# Patient Record
Sex: Female | Born: 1975 | Race: Black or African American | Hispanic: No | Smoking: Current every day smoker
Health system: Southern US, Community
[De-identification: ages and names within clinical notes are randomized; demographics above are authoritative.]

## PROBLEM LIST (undated history)

## (undated) DIAGNOSIS — J45909 Unspecified asthma, uncomplicated: Secondary | ICD-10-CM

## (undated) DIAGNOSIS — F419 Anxiety disorder, unspecified: Secondary | ICD-10-CM

## (undated) DIAGNOSIS — D649 Anemia, unspecified: Secondary | ICD-10-CM

## (undated) HISTORY — PX: TUBAL LIGATION: SHX77

---

## 2007-09-04 ENCOUNTER — Emergency Department: Payer: Self-pay | Admitting: Emergency Medicine

## 2015-09-21 ENCOUNTER — Emergency Department
Admission: EM | Admit: 2015-09-21 | Discharge: 2015-09-21 | Disposition: A | Discharge status: Home / Self Care | Payer: BLUE CROSS/BLUE SHIELD | Attending: Emergency Medicine | Admitting: Emergency Medicine

## 2015-09-21 ENCOUNTER — Encounter: Payer: Self-pay | Admitting: Emergency Medicine

## 2015-09-21 ENCOUNTER — Emergency Department: Payer: BLUE CROSS/BLUE SHIELD

## 2015-09-21 DIAGNOSIS — R0789 Other chest pain: Secondary | ICD-10-CM | POA: Diagnosis not present

## 2015-09-21 DIAGNOSIS — R002 Palpitations: Secondary | ICD-10-CM

## 2015-09-21 DIAGNOSIS — J45909 Unspecified asthma, uncomplicated: Secondary | ICD-10-CM | POA: Insufficient documentation

## 2015-09-21 DIAGNOSIS — F17210 Nicotine dependence, cigarettes, uncomplicated: Secondary | ICD-10-CM | POA: Diagnosis not present

## 2015-09-21 DIAGNOSIS — F419 Anxiety disorder, unspecified: Secondary | ICD-10-CM | POA: Diagnosis not present

## 2015-09-21 DIAGNOSIS — F17200 Nicotine dependence, unspecified, uncomplicated: Secondary | ICD-10-CM

## 2015-09-21 HISTORY — DX: Anxiety disorder, unspecified: F41.9

## 2015-09-21 HISTORY — DX: Anemia, unspecified: D64.9

## 2015-09-21 HISTORY — DX: Unspecified asthma, uncomplicated: J45.909

## 2015-09-21 LAB — BASIC METABOLIC PANEL
Anion gap: 8 (ref 5–15)
BUN: 8 mg/dL (ref 6–20)
CALCIUM: 9.1 mg/dL (ref 8.9–10.3)
CO2: 22 mmol/L (ref 22–32)
Chloride: 106 mmol/L (ref 101–111)
Creatinine, Ser: 0.67 mg/dL (ref 0.44–1.00)
GFR calc Af Amer: >60 mL/min (ref >60)
GFR calc non Af Amer: >60 mL/min (ref >60)
GLUCOSE: 99 mg/dL (ref 65–99)
POTASSIUM: 4 mmol/L (ref 3.5–5.1)
SODIUM: 136 mmol/L (ref 135–145)

## 2015-09-21 LAB — CBC
HCT: 39.7 % (ref 35.0–47.0)
HEMOGLOBIN: 13.8 g/dL (ref 12.0–16.0)
MCH: 29.4 pg (ref 26.0–34.0)
MCHC: 34.7 g/dL (ref 32.0–36.0)
MCV: 84.9 fL (ref 80.0–100.0)
Platelets: 335 10*3/uL (ref 150–440)
RBC: 4.68 MIL/uL (ref 3.80–5.20)
RDW: 14.6 % — ABNORMAL HIGH (ref 11.5–14.5)
WBC: 6.4 10*3/uL (ref 3.6–11.0)

## 2015-09-21 LAB — TSH: TSH: 0.4 u[IU]/mL (ref 0.350–4.500)

## 2015-09-21 LAB — TROPONIN I: Troponin I: <0.03 ng/mL (ref <0.03)

## 2015-09-21 NOTE — ED Provider Notes (Signed)
[facility] Emergency Department Provider Note  ____________________________________________  Time seen: Approximately 2:38 PM  I have reviewed the triage vital signs and the nursing notes.   HISTORY  Chief Complaint Palpitations    HPI Sheryl Bernard is a 40 y.o. female with a long history of anxiety currently off her medications, ongoing tobacco abuse, presenting with anxiety, palpitations, and chest tightness. The patient reports that her anxiety and panic attacks are previously well-controlled with paroxetine, but she has not been on those medications for several years. She generally treats her symptoms, which are described as shakiness, palpitations "like my heart is going to pound out of my chest" and occasional chest tightness with deep breaths. She recently restarted her health insurance, and was bringing her son to be evaluated in the emergency department when she began to have her typical symptoms so she checked in to be evaluated. She has no new symptoms today, nor are her symptoms different in severity or character. She denies any lightheadedness, fainting, leg swelling or calf pain. She does report a desire to initiate smoking cessation.   Past Medical History:  Diagnosis Date  . Anemia   . Anxiety   . Asthma     There are no active problems to display for this patient.   Past Surgical History:  Procedure Laterality Date  . TUBAL LIGATION        Allergies Bee venom; Tetracyclines & related; Ciprofloxacin; and Penicillins  No family history on file.  Social History Social History  Substance Use Topics  . Smoking status: Current Every Day Smoker    Packs/day: 1.00    Types: Cigarettes  . Smokeless tobacco: Never Used  . Alcohol use No    Review of Systems Constitutional: No fever/chills.No lightheadedness or syncope. Eyes: No visual changes. ENT: No sore throat. No congestion or rhinorrhea. Cardiovascular: Positive chest  tightness. Positive palpitations. Respiratory: Denies shortness of breath.  No cough. Gastrointestinal: No abdominal pain.  No nausea, no vomiting.  No diarrhea.  No constipation. Genitourinary: Negative for dysuria. Musculoskeletal: Negative for back pain. Skin: Negative for rash. Neurological: Negative for headaches. No focal numbness, tingling or weakness.  Psychiatric:Positive anxiety.  10-point ROS otherwise negative.  ____________________________________________   PHYSICAL EXAM:  VITAL SIGNS: ED Triage Vitals  Enc Vitals Group     BP 09/21/15 1133 128/70     Pulse Rate 09/21/15 1133 70     Resp 09/21/15 1133 18     Temp 09/21/15 1133 98.4 F (36.9 C)     Temp Source 09/21/15 1133 Oral     SpO2 09/21/15 1133 97 %     Weight 09/21/15 1134 188 lb (85.3 kg)     Height 09/21/15 1134 5\' 6"  (1.676 m)     Head Circumference --      Peak Flow --      Pain Score 09/21/15 1135 2     Pain Loc --      Pain Edu? --      Excl. in [place]? --     Constitutional: Alert and oriented. Anxious appearing but overall well appearing and in no acute distress. Answers questions appropriately. Eyes: Conjunctivae are normal.  EOMI. No scleral icterus. Head: Atraumatic. Nose: No congestion/rhinnorhea. Mouth/Throat: Mucous membranes are moist.  Neck: No stridor.  Supple.  No JVD. Cardiovascular: Normal rate, regular rhythm. No murmurs, rubs or gallops.  Respiratory: Normal respiratory effort.  No accessory muscle use or retractions. Lungs CTAB.  No wheezes, rales or ronchi. Gastrointestinal:  Soft, nontender and nondistended.  No guarding or rebound.  No peritoneal signs. Musculoskeletal: No LE edema. No ttp in the calves or palpable cords.  Negative Homan's sign. Neurologic:  A&Ox3.  Speech is clear.  Face and smile are symmetric.  EOMI.  Moves all extremities well. Skin:  Skin is warm, dry and intact. No rash noted. Psychiatric: Anxious mood without pressured speech. Speech and behavior are  normal.  Normal judgement.  ____________________________________________   LABS (all labs ordered are listed, but only abnormal results are displayed)  Labs Reviewed  CBC - Abnormal; Notable for the following:       Result Value   RDW 14.6 (*)    All other components within normal limits  BASIC METABOLIC PANEL  TROPONIN I  TSH   ____________________________________________  EKG  ED ECG REPORT I, Eula Listen, the attending physician, personally viewed and interpreted this ECG.   Date: 09/21/2015  EKG Time: 1457  Rate: 71  Rhythm: normal sinus rhythm  Axis: Normal  Intervals:none  ST&T Change: Nonspecific T-wave inversion in V1. No ST elevation. No evidence of Brugada, prolonged QTC, or hypertrophy.  ____________________________________________  RADIOLOGY  Dg Chest 2 View  Result Date: 09/21/2015 CLINICAL DATA:  Palpitations and shortness of breath. History of asthma. EXAM: CHEST  2 VIEW COMPARISON:  None. FINDINGS: Mild cardiac enlargement. No pleural effusion or edema. No airspace opacities. The visualized osseous structures are unremarkable. IMPRESSION: 1. No acute cardiopulmonary abnormalities. Electronically Signed   By: Kerby Moors M.D.   On: 09/21/2015 12:05    ____________________________________________   PROCEDURES  Procedure(s) performed: None  Procedures  Critical Care performed: No ____________________________________________   INITIAL IMPRESSION / ASSESSMENT AND PLAN / ED COURSE  Pertinent labs & imaging results that were available during my care of the patient were reviewed by me and considered in my medical decision making (see chart for details).  40 y.o. female with a history of anxiety currently off her medications presenting with typical anxiety symptoms. Overall, the patient does appear anxious but has no focal cardiopulmonary findings. Her EKG does not show any arrhythmia or ischemic changes. She has reassuring laboratory  studies with normal electrolytes, no evidence of anemia, and a normal troponin. Her TSH is pending and she will have her primary care physician follow this up. We had a long discussion about smoking cessation and she will follow up with her primary care physician for this is well. At this time, there is no evidence that the patient has any acute life-threatening condition causing her symptoms. I have given her the number for the [facility] clinic, so that she may reestablish a primary care physician for routine health maintenance, evaluation of her anxiety, follow-up for her TSH. Ouida Sills return precautions as well as follow-up instructions.  ____________________________________________  FINAL CLINICAL IMPRESSION(S) / ED DIAGNOSES  Final diagnoses:  Palpitations  Anxiety  Smoking addiction    Clinical Course      NEW MEDICATIONS STARTED DURING THIS VISIT:  New Prescriptions   No medications on file      Eula Listen, MD 09/21/15 1443

## 2015-09-21 NOTE — ED Notes (Signed)
Patient is anxious.  States has taken medications for anxiety in the past.

## 2015-09-21 NOTE — ED Triage Notes (Signed)
Patient states she is under a lot of stress and has been feeling shaky, palpitations, SOB since Tuesday.

## 2015-09-21 NOTE — Discharge Instructions (Signed)
Please establish a primary care physician at the [facility] clinic who will be able to address your anxiety, help you quit smoking, and for routine health maintenance.  Return to the emergency department if you  develop severe pain, lightheadedness or fainting, shortness of breath, or any other symptoms concerning to you.

## 2015-12-12 ENCOUNTER — Emergency Department
Admission: EM | Admit: 2015-12-12 | Discharge: 2015-12-12 | Disposition: A | Discharge status: Home / Self Care | Payer: BLUE CROSS/BLUE SHIELD | Attending: Emergency Medicine | Admitting: Emergency Medicine

## 2015-12-12 ENCOUNTER — Emergency Department: Payer: BLUE CROSS/BLUE SHIELD

## 2015-12-12 ENCOUNTER — Encounter: Payer: Self-pay | Admitting: Emergency Medicine

## 2015-12-12 DIAGNOSIS — Y929 Unspecified place or not applicable: Secondary | ICD-10-CM | POA: Diagnosis not present

## 2015-12-12 DIAGNOSIS — W231XXA Caught, crushed, jammed, or pinched between stationary objects, initial encounter: Secondary | ICD-10-CM | POA: Insufficient documentation

## 2015-12-12 DIAGNOSIS — Y999 Unspecified external cause status: Secondary | ICD-10-CM | POA: Insufficient documentation

## 2015-12-12 DIAGNOSIS — J9801 Acute bronchospasm: Secondary | ICD-10-CM

## 2015-12-12 DIAGNOSIS — Y939 Activity, unspecified: Secondary | ICD-10-CM | POA: Insufficient documentation

## 2015-12-12 DIAGNOSIS — J069 Acute upper respiratory infection, unspecified: Secondary | ICD-10-CM

## 2015-12-12 DIAGNOSIS — F17210 Nicotine dependence, cigarettes, uncomplicated: Secondary | ICD-10-CM | POA: Insufficient documentation

## 2015-12-12 DIAGNOSIS — S60031A Contusion of right middle finger without damage to nail, initial encounter: Secondary | ICD-10-CM

## 2015-12-12 DIAGNOSIS — S6991XA Unspecified injury of right wrist, hand and finger(s), initial encounter: Secondary | ICD-10-CM | POA: Diagnosis present

## 2015-12-12 MED ORDER — AZITHROMYCIN 250 MG PO TABS: ORAL_TABLET | ORAL | 0 refills | Status: DC

## 2015-12-12 MED ORDER — IPRATROPIUM-ALBUTEROL 0.5-2.5 (3) MG/3ML IN SOLN
3.0000 mL | Freq: Once | RESPIRATORY_TRACT | Status: AC
  Administered 2015-12-12: 3 mL via RESPIRATORY_TRACT
  Filled 2015-12-12: qty 3

## 2015-12-12 MED ORDER — ALBUTEROL SULFATE HFA 108 (90 BASE) MCG/ACT IN AERS: 1.0000 | INHALATION_SPRAY | Freq: Four times a day (QID) | RESPIRATORY_TRACT | 0 refills | Status: DC | PRN

## 2015-12-12 MED ORDER — NAPROXEN 500 MG PO TABS: 500.0000 mg | ORAL_TABLET | Freq: Two times a day (BID) | ORAL | 0 refills | Status: DC

## 2015-12-12 MED ORDER — PSEUDOEPH-BROMPHEN-DM 30-2-10 MG/5ML PO SYRP: 10.0000 mL | ORAL_SOLUTION | Freq: Four times a day (QID) | ORAL | 0 refills | Status: DC | PRN

## 2015-12-12 NOTE — ED Provider Notes (Signed)
[facility] Emergency Department Provider Note  ____________________________________________  Time seen: Approximately 12:22 PM  I have reviewed the triage vital signs and the nursing notes.   HISTORY  Chief Complaint Asthma Attack    HPI Sheryl Bernard is a 40 y.o. female , NAD, presents to the emergency department with one-day history of wheezing and shortness of breath. Patient states she has had cold symptoms including nasal congestion, runny nose and cough over the last 2-3 days. Has a history of asthma and is noted over the last 24 hours she has had increasing shortness of breath and some wheezing. States she is out of her albuterol inhaler but used her signs nebulizer last night with albuterol which seemed to help. This morning her symptoms continued to feel worse and included some fatigue. States that she closed her right index and middle finger in the tailgate of her truck when she was not paying attention. Has pain about both fingers but swelling about the right middle finger. Denies any open wounds or lacerations. Denies any chest pain, abdominal pain, nausea or vomiting. Has had no visual changes, numbness, weakness, tingling. Denies ear pain, sore throat.   Past Medical History:  Diagnosis Date  . Anemia   . Anxiety   . Asthma     There are no active problems to display for this patient.   Past Surgical History:  Procedure Laterality Date  . TUBAL LIGATION      Prior to Admission medications   Medication Sig Start Date End Date Taking? Authorizing Provider  albuterol (PROVENTIL HFA;VENTOLIN HFA) 108 (90 Base) MCG/ACT inhaler Inhale 1-2 puffs into the lungs every 6 (six) hours as needed for wheezing or shortness of breath. 12/12/15   Jami L Hagler, PA-C  azithromycin (ZITHROMAX Z-PAK) 250 MG tablet Take 2 tablets (500 mg) on  Day 1,  followed by 1 tablet (250 mg) once daily on Days 2 through 5. 12/12/15   Jami L Hagler, PA-C   brompheniramine-pseudoephedrine-DM 30-2-10 MG/5ML syrup Take 10 mLs by mouth 4 (four) times daily as needed. 12/12/15   Jami L Hagler, PA-C  naproxen (NAPROSYN) 500 MG tablet Take 1 tablet (500 mg total) by mouth 2 (two) times daily with a meal. 12/12/15   Jami L Hagler, PA-C    Allergies Bee venom; Tetracyclines & related; Ciprofloxacin; and Penicillins  No family history on file.  Social History Social History  Substance Use Topics  . Smoking status: Current Every Day Smoker    Packs/day: 1.00    Types: Cigarettes  . Smokeless tobacco: Never Used  . Alcohol use No     Review of Systems Constitutional: Positive fatigue. No fever/chills Eyes: No visual changes.  ENT: Positive nasal congestion, runny nose. No sore throat, ear pain. Cardiovascular: No chest pain. Respiratory: Positive cough, chest congestion, shortness of breath, wheezing.  Gastrointestinal: No abdominal pain.  No nausea, vomiting.  No diarrhea.   Musculoskeletal: Positive right index and middle finger pain. Negative for myalgias.  Skin: Also swelling and redness right middle finger. Negative for rash, bruising, open wounds or lacerations. Neurological: Negative for headaches, focal weakness or numbness. No tingling. 10-point ROS otherwise negative.  ____________________________________________   PHYSICAL EXAM:  VITAL SIGNS: ED Triage Vitals [12/12/15 1150]  Enc Vitals Group     BP 126/73     Pulse Rate 90     Resp 20     Temp 97.7 F (36.5 C)     Temp Source Oral  SpO2 100 %     Weight 180 lb (81.6 kg)     Height 5\' 6"  (1.676 m)     Head Circumference      Peak Flow      Pain Score      Pain Loc      Pain Edu?      Excl. in [place]?      Constitutional: Alert and oriented. Well appearing and in no acute distress. Eyes: Conjunctivae are normal Without icterus or injection. Head: Atraumatic. ENT:      Ears: TMs visualized bilaterally with mild serous effusion but no bulging, erythema,  perforation.      Nose: Mild congestion with moderate clear rhinorrhea.      Mouth/Throat: Mucous membranes are moist. Pharynx without erythema, swelling, exudate. Clear postnasal drip. Uvula is midline. Airway is patent. Neck: No stridor. No carotid bruits. Supple with full range of motion. No meningismus. Hematological/Lymphatic/Immunilogical: No cervical lymphadenopathy. Cardiovascular: Normal rate, regular rhythm. Normal S1 and S2.  Good peripheral circulation. Respiratory: Normal respiratory effort without tachypnea or retractions. Bilateral middle and lower lobes with rhonchi and mild wheeze. Breath sounds are noted throughout bilateral lung fields. Musculoskeletal: Tenderness to palpation about the proximal phalanx of the right middle finger as well as tenderness about the PIP. Mild swelling is noted about the PIP joint without crepitus or bony abnormalities. Full range of motion of all other digits on the right hand without pain or difficulty. Neurologic:  Normal speech and language. No gross focal neurologic deficits are appreciated.  Skin:  Skin is warm, dry and intact. No rash noted. Psychiatric: Mood and affect are normal. Speech and behavior are normal. Patient exhibits appropriate insight and judgement.   ____________________________________________   LABS  None ____________________________________________  EKG  None ____________________________________________  RADIOLOGY I, Braxton Feathers, personally viewed and evaluated these images (plain radiographs) as part of my medical decision making, as well as reviewing the written report by the radiologist.  Dg Chest 2 View  Result Date: 12/12/2015 CLINICAL DATA:  Pt reports asthma attack today; Pt reports having cold symptoms for several days but shortness of breath increased this morning. Pt gets slightly winded when speaking but able to speak in complete sentences. Pt states she is out.*comment was truncated* EXAM: CHEST  2  VIEW COMPARISON:  Chest radiograph 09/21/2015 FINDINGS: Normal mediastinum and cardiac silhouette. Normal pulmonary vasculature. Mild coarsened central bronchovascular markings. No evidence of effusion, infiltrate, or pneumothorax. No acute bony abnormality. IMPRESSION: Mild coarsened central bronchovascular markings. No edema or infiltrate. No pneumothorax. Electronically Signed   By: Suzy Bouchard M.D.   On: 12/12/2015 12:55   Dg Finger Middle Right  Result Date: 12/12/2015 CLINICAL DATA:  Pain and swelling secondary to crush injury in the tailgate of a truck. EXAM: RIGHT MIDDLE FINGER 2+V COMPARISON:  None. FINDINGS: There is no evidence of fracture or dislocation. There is no evidence of arthropathy or other focal bone abnormality. Soft tissues are unremarkable. IMPRESSION: Negative. Electronically Signed   By: Lorriane Shire M.D.   On: 12/12/2015 13:43    ____________________________________________    PROCEDURES  Procedure(s) performed: None   Procedures   Medications  ipratropium-albuterol (DUONEB) 0.5-2.5 (3) MG/3ML nebulizer solution 3 mL (3 mLs Nebulization Given 12/12/15 1252)   Respiratory exam after administration of DuoNeb was significantly improved. There are no further wheezes and only trace rhonchi which I feel is more congestion that is mobile. Patient notes she is no longer short of breath and feels  she can breathe better. ____________________________________________   INITIAL IMPRESSION / ASSESSMENT AND PLAN / ED COURSE  Pertinent labs & imaging results that were available during my care of the patient were reviewed by me and considered in my medical decision making (see chart for details).  Clinical Course     Patient's diagnosis is consistent with Upper respiratory infection, acute bronchospasm and contusion of the right middle finger. Patient's right middle finger was placed in a metal fingers went for comfort care. She is to ice the area 20 minutes 3-4  times daily as needed. Patient is advised to remove the finger splint when she is not working or using the hand and complete range of motion exercises. Patient will be discharged home with prescriptions for albuterol inhaler, azithromycin, Bromfed-DM cough syrup and Naprosyn to take as directed. Patient is to follow up with [facility] if symptoms persist past this treatment course. Patient is given ED precautions to return to the ED for any worsening or new symptoms.    ____________________________________________  FINAL CLINICAL IMPRESSION(S) / ED DIAGNOSES  Final diagnoses:  Upper respiratory tract infection, unspecified type  Bronchospasm, acute  Contusion of right middle finger without damage to nail, initial encounter      NEW MEDICATIONS STARTED DURING THIS VISIT:  New Prescriptions   ALBUTEROL (PROVENTIL HFA;VENTOLIN HFA) 108 (90 BASE) MCG/ACT INHALER    Inhale 1-2 puffs into the lungs every 6 (six) hours as needed for wheezing or shortness of breath.   AZITHROMYCIN (ZITHROMAX Z-PAK) 250 MG TABLET    Take 2 tablets (500 mg) on  Day 1,  followed by 1 tablet (250 mg) once daily on Days 2 through 5.   BROMPHENIRAMINE-PSEUDOEPHEDRINE-DM 30-2-10 MG/5ML SYRUP    Take 10 mLs by mouth 4 (four) times daily as needed.   NAPROXEN (NAPROSYN) 500 MG TABLET    Take 1 tablet (500 mg total) by mouth 2 (two) times daily with a meal.         Braxton Feathers, PA-C 12/12/15 1355    Earleen Newport, MD 12/12/15 1550

## 2015-12-12 NOTE — ED Notes (Signed)
See triage note  States she is out of inhaler and feels like she is having some SOB  Able to speak full sentences but does become slightly winded  Also having pain to right hand after shutting it in truck

## 2015-12-12 NOTE — ED Triage Notes (Addendum)
Pt reports asthma attack today. Pt reports having cold symptoms for several days but shortness of breath increased this morning. Pt reports history of asthma. Pt gets slightly winded when speaking but able to speak in complete sentences. Pt states she is out of inhaler medications. Pt reports she also slammed the fingers of her right hand in the tailgate of a truck.

## 2016-10-31 ENCOUNTER — Emergency Department
Admission: EM | Admit: 2016-10-31 | Discharge: 2016-10-31 | Disposition: A | Discharge status: Home / Self Care | Payer: BLUE CROSS/BLUE SHIELD | Attending: Student in an Organized Health Care Education/Training Program | Admitting: Student in an Organized Health Care Education/Training Program

## 2016-10-31 ENCOUNTER — Encounter: Payer: Self-pay | Admitting: Emergency Medicine

## 2016-10-31 DIAGNOSIS — F17210 Nicotine dependence, cigarettes, uncomplicated: Secondary | ICD-10-CM | POA: Insufficient documentation

## 2016-10-31 DIAGNOSIS — T782XXA Anaphylactic shock, unspecified, initial encounter: Secondary | ICD-10-CM | POA: Insufficient documentation

## 2016-10-31 DIAGNOSIS — J45909 Unspecified asthma, uncomplicated: Secondary | ICD-10-CM | POA: Insufficient documentation

## 2016-10-31 DIAGNOSIS — T63441A Toxic effect of venom of bees, accidental (unintentional), initial encounter: Secondary | ICD-10-CM | POA: Insufficient documentation

## 2016-10-31 MED ORDER — EPINEPHRINE 0.3 MG/0.3ML IJ SOAJ
0.3000 mg | Freq: Once | INTRAMUSCULAR | Status: AC
  Administered 2016-10-31: 0.3 mg via INTRAMUSCULAR

## 2016-10-31 MED ORDER — METHYLPREDNISOLONE SODIUM SUCC 125 MG IJ SOLR
125.0000 mg | Freq: Once | INTRAMUSCULAR | Status: AC
  Administered 2016-10-31: 125 mg via INTRAVENOUS

## 2016-10-31 MED ORDER — IPRATROPIUM-ALBUTEROL 0.5-2.5 (3) MG/3ML IN SOLN
3.0000 mL | Freq: Once | RESPIRATORY_TRACT | Status: AC
  Administered 2016-10-31: 3 mL via RESPIRATORY_TRACT
  Filled 2016-10-31: qty 3

## 2016-10-31 MED ORDER — PREDNISONE 10 MG PO TABS: 10.0000 mg | ORAL_TABLET | Freq: Every day | ORAL | 0 refills | Status: DC

## 2016-10-31 MED ORDER — EPINEPHRINE 0.3 MG/0.3ML IJ SOAJ: 0.3000 mg | Freq: Once | INTRAMUSCULAR | 1 refills | Status: AC

## 2016-10-31 MED ORDER — SODIUM CHLORIDE 0.9 % IV BOLUS (SEPSIS)
1000.0000 mL | Freq: Once | INTRAVENOUS | Status: AC
  Administered 2016-10-31: 1000 mL via INTRAVENOUS

## 2016-10-31 MED ORDER — DIPHENHYDRAMINE HCL 50 MG/ML IJ SOLN
25.0000 mg | Freq: Once | INTRAMUSCULAR | Status: AC
  Administered 2016-10-31: 25 mg via INTRAVENOUS

## 2016-10-31 NOTE — ED Notes (Signed)
Oxygen discontinued.

## 2016-10-31 NOTE — ED Notes (Signed)
Pt talking on the phone and able to speak in normal voice. Pt reports feeling much better after epi pen and medications. Pt c/o pain from sting on back and given ice pack.

## 2016-10-31 NOTE — ED Provider Notes (Signed)
[facility] Emergency Department Provider Note    First MD Initiated Contact with Patient 10/31/16 1605     (approximate)  I have reviewed the triage vital signs and the nursing notes.   HISTORY  Chief Complaint Allergic Reaction    HPI Sheryl Bernard is a 41 y.o. 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 States that she was stung twice and points to her right shoulder and right back. Patient with diminished breath sounds throughout and was in acute respiratory distress. Patient reports saturating well on 2 L nasal cannula. Based her her respiratory distress she was given IM epinephrine with subsequent improvement in her symptoms. Denies any nausea or vomiting. No abdominal pain.   Past Medical History:  Diagnosis Date  . Anemia   . Anxiety   . Asthma    History reviewed. No pertinent family history. Past Surgical History:  Procedure Laterality Date  . TUBAL LIGATION     There are no active problems to display for this patient.     Prior to Admission medications   Medication Sig Start Date End Date Taking? Authorizing Provider  albuterol (PROVENTIL HFA;VENTOLIN HFA) 108 (90 Base) MCG/ACT inhaler Inhale 1-2 puffs into the lungs every 6 (six) hours as needed for wheezing or shortness of breath. 12/12/15   Hagler, Jami L, PA-C  azithromycin (ZITHROMAX Z-PAK) 250 MG tablet Take 2 tablets (500 mg) on  Day 1,  followed by 1 tablet (250 mg) once daily on Days 2 through 5. Patient not taking: Reported on 10/31/2016 12/12/15   Hagler, Jami L, PA-C  brompheniramine-pseudoephedrine-DM 30-2-10 MG/5ML syrup Take 10 mLs by mouth 4 (four) times daily as needed. Patient not taking: Reported on 10/31/2016 12/12/15   Hagler, Jami L, PA-C  EPINEPHrine 0.3 mg/0.3 mL IJ SOAJ injection Inject 0.3 mLs (0.3 mg total) into  the muscle once. 10/31/16 10/31/16  Merlyn Lot, MD  naproxen (NAPROSYN) 500 MG tablet Take 1 tablet (500 mg total) by mouth 2 (two) times daily with a meal. Patient not taking: Reported on 10/31/2016 12/12/15   Hagler, Jami L, PA-C  predniSONE (DELTASONE) 10 MG tablet Take 1 tablet (10 mg total) by mouth daily. Day 1-2: Take 50 mg  ( 5 pills) Day 3-4 : Take 40 mg (4pills) Day 5-6: Take 30 mg (3 pills) Day 7-8:  Take 20 mg (2 pills) Day 9:  Take 10mg  (1 pill) 10/31/16   Merlyn Lot, MD    Allergies Bee venom; Penicillins; Tetracyclines & related; and Ciprofloxacin    Social History Social History  Substance Use Topics  . Smoking status: Current Every Day Smoker    Packs/day: 0.50    Types: Cigarettes  . Smokeless tobacco: Never Used  . Alcohol use No    Review of Systems Patient denies headaches, rhinorrhea, blurry vision, numbness, shortness of breath, chest pain, edema, cough, abdominal pain, nausea, vomiting, diarrhea, dysuria, fevers, rashes or hallucinations unless otherwise stated above in HPI. ____________________________________________   PHYSICAL EXAM:  VITAL SIGNS: Vitals:   10/31/16 1800 10/31/16 1830  BP: 104/71 120/65  Pulse: 93 97  Resp: (!) 23 (!) 26  Temp:    SpO2: 98% 98%    Constitutional: Alert and oriented. Critically ill appearing in moderate respiratory distress Eyes: Conjunctivae are normal.  Head: Atraumatic. Nose: No congestion/rhinnorhea. Mouth/Throat: Mucous membranes are moist.   Neck: No  stridor. Painless ROM.  Cardiovascular: Normal rate, regular rhythm. Grossly normal heart sounds.  Good peripheral circulation. Respiratory: Normal respiratory effort.  No retractions. Lungs CTAB. Gastrointestinal: Soft and nontender. No distention. No abdominal bruits. No CVA tenderness. Musculoskeletal: No lower extremity tenderness nor edema.  No joint effusions. Neurologic:  Normal speech and language. No gross focal neurologic deficits are  appreciated. No facial droop Skin:  Skin is warm, dry and intact. No rash noted. Psychiatric: Mood and affect are normal. Speech and behavior are normal.  ____________________________________________   LABS (all labs ordered are listed, but only abnormal results are displayed)  No results found for this or any previous visit (from the past 24 hour(s)). ____________________________________________ ____________________________________________  [id number]   ____________________________________________   PROCEDURES  Procedure(s) performed:  Procedures    Critical Care performed: no ____________________________________________   INITIAL IMPRESSION / ASSESSMENT AND PLAN / ED COURSE  Pertinent labs & imaging results that were available during my care of the patient were reviewed by me and considered in my medical decision making (see chart for details).  DDX: anaphylaxis, allergic reaction, asthma  Sheryl Bernard is a 41 y.o. who presents to the ED with acute respiratory distress and shortness of breath as described above this occurred after having a bee sting. Due to her critical appearance patient was given IM epinephrine and given steroids and Benadryl with improvement in her symptoms. We'll continue to observe.  Clinical Course as of Oct 31 1899  Nancy Fetter Oct 31, 2016  1744 Reassessed with significant improvement in her symptoms. No shortness of breath. No wheezing on exam. We'll continue to monitor.  [PR]    Clinical Course User Index [PR] Merlyn Lot, MD    Patient remains asymptomatic. No wheezing on exam. No shortness of breath. Symptoms have resolved at this point as she does have EpiPen at home and has had allergic reactions in the past with a good understanding of strict return precautions and believe that she is stable for discharge home. ____________________________________________   FINAL CLINICAL IMPRESSION(S) / ED DIAGNOSES  Final diagnoses:  Anaphylaxis,  initial encounter      NEW MEDICATIONS STARTED DURING THIS VISIT:  Discharge Medication List as of 10/31/2016  6:00 PM    START taking these medications   Details  EPINEPHrine 0.3 mg/0.3 mL IJ SOAJ injection Inject 0.3 mLs (0.3 mg total) into the muscle once., Starting Sun 10/31/2016, Print    predniSONE (DELTASONE) 10 MG tablet Take 1 tablet (10 mg total) by mouth daily. Day 1-2: Take 50 mg  ( 5 pills) Day 3-4 : Take 40 mg (4pills) Day 5-6: Take 30 mg (3 pills) Day 7-8:  Take 20 mg (2 pills) Day 9:  Take 10mg  (1 pill), Starting Sun 10/31/2016, Print         Note:  This document was prepared using Dragon voice recognition software and may include unintentional dictation errors.    Merlyn Lot, MD 10/31/16 Lurline Hare

## 2016-10-31 NOTE — ED Triage Notes (Signed)
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 Pt was given epi pen on arrival to room in right thigh.  Pt improved after epi and states she was putting something in the donation box and was stung by a wasp on the chest and on the back.  Pt did not have epi pen with her at the time.  Pt is alert and talking at this time.

## 2017-05-04 ENCOUNTER — Other Ambulatory Visit: Payer: Self-pay

## 2017-05-04 ENCOUNTER — Encounter: Payer: Self-pay | Admitting: Emergency Medicine

## 2017-05-04 ENCOUNTER — Ambulatory Visit
Admission: EM | Admit: 2017-05-04 | Discharge: 2017-05-04 | Disposition: A | Discharge status: Home / Self Care | Payer: Medicaid Other | Attending: Family Medicine | Admitting: Family Medicine

## 2017-05-04 DIAGNOSIS — J45901 Unspecified asthma with (acute) exacerbation: Secondary | ICD-10-CM

## 2017-05-04 DIAGNOSIS — R0981 Nasal congestion: Secondary | ICD-10-CM

## 2017-05-04 MED ORDER — HYDROCOD POLST-CPM POLST ER 10-8 MG/5ML PO SUER: 5.0000 mL | Freq: Every evening | ORAL | 0 refills | Status: DC | PRN

## 2017-05-04 MED ORDER — ALBUTEROL SULFATE HFA 108 (90 BASE) MCG/ACT IN AERS: 2.0000 | INHALATION_SPRAY | RESPIRATORY_TRACT | 0 refills | Status: DC | PRN

## 2017-05-04 MED ORDER — PREDNISONE 10 MG PO TABS: ORAL_TABLET | ORAL | 0 refills | Status: DC

## 2017-05-04 MED ORDER — BENZONATATE 100 MG PO CAPS: 100.0000 mg | ORAL_CAPSULE | Freq: Three times a day (TID) | ORAL | 0 refills | Status: DC | PRN

## 2017-05-04 MED ORDER — AZITHROMYCIN 250 MG PO TABS: ORAL_TABLET | ORAL | 0 refills | Status: DC

## 2017-05-04 NOTE — Discharge Instructions (Addendum)
Take medication as prescribed. Rest. Drink plenty of fluids.  ° °Follow up with your primary care physician this week as needed. Return to Urgent care for new or worsening concerns.  ° °

## 2017-05-04 NOTE — ED Triage Notes (Signed)
Patient in today c/o 2 week history of productive cough, emesis, body aches. Patient does have asthma. Patient denies fever, but has been taking Ibuprofen and Tylenol. Patient states her son was diagnosed with flu on 04/04/17.

## 2017-05-04 NOTE — ED Provider Notes (Signed)
[facility] ____________________________________________  Time seen: Approximately 7:21 PM  I have reviewed the triage vital signs and the nursing notes.   HISTORY  Chief Complaint Emesis and Cough   HPI Sheryl Bernard is a 42 y.o. female presented for evaluation of approximately 2 weeks of cough and congestion symptoms.  States symptoms started out initially with some GI upset as well that has since resolved.  States is continued with a lot of nasal drainage, nasal congestion, cough.  Reports occasionally feels that she is wheezing.  Reports history of asthma and does not currently have an albuterol inhaler.  States her son had influenza and a secondary sinus infection that occurred in March.  Also with her other son similar sick.  Patient states he has had some intermittent fevers, stating initially had a fever at sickness onset, and reports of the last 2-3 days has also felt she has had a fever again.  States cough is productive of yellowish whitish sputum, and yellowish nasal drainage.  States cough is disrupting sleep.  Continues to remain active.  Continues to eat and drink well.  Has been drinking a lot of tea, taking intermittent over-the-counter Tylenol and ibuprofen.  Denies other aggravating or alleviating factors. Denies chest pain, shortness of breath, abdominal pain, extremity swelling or rash. Denies recent sickness. Denies recent antibiotic use.   Patient's last menstrual period was 02/03/2017 (approximate). Denies pregnancy.   Past Medical History:  Diagnosis Date  . Anemia   . Anxiety   . Asthma     There are no active problems to display for this patient.   Past Surgical History:  Procedure Laterality Date  . TUBAL LIGATION       No current facility-administered medications for this encounter.   Current Outpatient Medications:  .  albuterol (PROVENTIL HFA;VENTOLIN HFA) 108 (90 Base) MCG/ACT inhaler, Inhale 2 puffs into the lungs every 4 (four)  hours as needed for wheezing., Disp: 1 Inhaler, Rfl: 0 .  azithromycin (ZITHROMAX Z-PAK) 250 MG tablet, Take 2 tablets (500 mg) on  Day 1,  followed by 1 tablet (250 mg) once daily on Days 2 through 5., Disp: 6 each, Rfl: 0 .  benzonatate (TESSALON PERLES) 100 MG capsule, Take 1 capsule (100 mg total) by mouth 3 (three) times daily as needed for cough., Disp: 15 capsule, Rfl: 0 .  chlorpheniramine-HYDROcodone (TUSSIONEX PENNKINETIC ER) 10-8 MG/5ML SUER, Take 5 mLs by mouth at bedtime as needed for cough. do not drive or operate machinery while taking as can cause drowsiness., Disp: 50 mL, Rfl: 0 .  predniSONE (DELTASONE) 10 MG tablet, Start 60 mg po day one, then 50 mg po day two, taper by 10 mg daily until complete., Disp: 21 tablet, Rfl: 0  Allergies Bee venom; Penicillins; Tetracyclines & related; and Ciprofloxacin  Family History  Problem Relation Age of Onset  . Liver disease Mother   . Atrial fibrillation Mother   . Breast cancer Mother   . Hyperlipidemia Mother   . Congestive Heart Failure Father   . Vascular Disease Father     Social History Social History   Tobacco Use  . Smoking status: Current Every Day Smoker    Packs/day: 0.50    Types: Cigarettes  . Smokeless tobacco: Never Used  Substance Use Topics  . Alcohol use: No  . Drug use: Yes    Types: Marijuana    Review of Systems Constitutional: As above.  ENT As above. States occasional sore throat from coughing.  Cardiovascular:  Denies chest pain. Respiratory: Denies shortness of breath. Gastrointestinal: No abdominal pain. . Musculoskeletal: Negative for back pain. Skin: Negative for rash.   ____________________________________________   PHYSICAL EXAM:  VITAL SIGNS: ED Triage Vitals [05/04/17 1850]  Enc Vitals Group     BP 116/66     Pulse Rate 78     Resp 16     Temp 98.5 F (36.9 C)     Temp Source Oral     SpO2 100 %     Weight 195 lb (88.5 kg)     Height 5\' 6"  (1.676 m)     Head  Circumference      Peak Flow      Pain Score 4     Pain Loc      Pain Edu?      Excl. in [place]?    Constitutional: Alert and oriented. Well appearing and in no acute distress. Eyes: Conjunctivae are normal.  Head: Atraumatic. No sinus tenderness to palpation. No swelling. No erythema.  Ears: no erythema, normal TMs bilaterally.   Nose:Nasal congestion with clear rhinorrhea  Mouth/Throat: Mucous membranes are moist. No pharyngeal erythema. No tonsillar swelling or exudate.  Neck: No stridor.  No cervical spine tenderness to palpation. Hematological/Lymphatic/Immunilogical: No cervical lymphadenopathy. Cardiovascular: Normal rate, regular rhythm. Grossly normal heart sounds.  Good peripheral circulation. Respiratory: Normal respiratory effort.  No retractions. No wheezes, rales or rhonchi. Good air movement.  Dry intermittent cough with bronchospasm.  Speaks in complete sentences. Musculoskeletal: Ambulatory with steady gait.  Neurologic:  Normal speech and language. No gait instability. Skin:  Skin appears warm, dry and intact. No rash noted. Psychiatric: Mood and affect are normal. Speech and behavior are normal.  ___________________________________________   LABS (all labs ordered are listed, but only abnormal results are displayed)  Labs Reviewed - No data to display  PROCEDURES Procedures    INITIAL IMPRESSION / ASSESSMENT AND PLAN / ED COURSE  Pertinent labs & imaging results that were available during my care of the patient were reviewed by me and considered in my medical decision making (see chart for details).  Well-appearing patient.  No acute distress.  Suspect recent viral illness with secondary asthma exacerbation.  Concern for secondary infection with recent fever return, no focal area of consolidation auscultated.  Will treat patient with prednisone taper, albuterol inhaler use, azithromycin, appearing Tessalon Perles and Tussionex.  Encourage rest, fluids, supportive  care.  Discussed chest x-ray, will defer at this time, patient agrees.Discussed indication, risks and benefits of medications with patient.  Discussed follow up with Primary care physician this week. Discussed follow up and return parameters including no resolution or any worsening concerns. Patient verbalized understanding and agreed to plan.   ____________________________________________   FINAL CLINICAL IMPRESSION(S) / ED DIAGNOSES  Final diagnoses:  Asthma with acute exacerbation, unspecified asthma severity, unspecified whether persistent  Nasal congestion     ED Discharge Orders        Ordered    predniSONE (DELTASONE) 10 MG tablet     05/04/17 1930    azithromycin (ZITHROMAX Z-PAK) 250 MG tablet     05/04/17 1930    albuterol (PROVENTIL HFA;VENTOLIN HFA) 108 (90 Base) MCG/ACT inhaler  Every 4 hours PRN     05/04/17 1930    benzonatate (TESSALON PERLES) 100 MG capsule  3 times daily PRN     05/04/17 1930    chlorpheniramine-HYDROcodone (TUSSIONEX PENNKINETIC ER) 10-8 MG/5ML SUER  At bedtime PRN     05/04/17 1933  Note: This dictation was prepared with Dragon dictation along with smaller phrase technology. Any transcriptional errors that result from this process are unintentional.         Marylene Land, NP 05/04/17 2000

## 2017-10-17 ENCOUNTER — Ambulatory Visit
Admission: EM | Admit: 2017-10-17 | Discharge: 2017-10-17 | Disposition: A | Discharge status: Home / Self Care | Payer: Medicaid Other

## 2017-10-17 ENCOUNTER — Encounter: Payer: Self-pay | Admitting: Emergency Medicine

## 2017-10-17 ENCOUNTER — Other Ambulatory Visit: Payer: Self-pay

## 2017-10-17 ENCOUNTER — Ambulatory Visit: Payer: Medicaid Other

## 2017-10-17 NOTE — ED Triage Notes (Signed)
Patient stated she hit the dashboard with her right hand trying to hit a bug and she injured her right thumb.

## 2017-10-18 ENCOUNTER — Ambulatory Visit
Admission: EM | Admit: 2017-10-18 | Discharge: 2017-10-18 | Disposition: A | Discharge status: Home / Self Care | Payer: Medicaid Other | Attending: Family Medicine | Admitting: Family Medicine

## 2017-10-18 ENCOUNTER — Ambulatory Visit: Payer: Medicaid Other

## 2017-10-18 ENCOUNTER — Encounter: Payer: Self-pay | Admitting: Emergency Medicine

## 2017-10-18 ENCOUNTER — Other Ambulatory Visit: Payer: Self-pay

## 2017-10-18 DIAGNOSIS — Z881 Allergy status to other antibiotic agents status: Secondary | ICD-10-CM | POA: Insufficient documentation

## 2017-10-18 DIAGNOSIS — Z91030 Bee allergy status: Secondary | ICD-10-CM | POA: Insufficient documentation

## 2017-10-18 DIAGNOSIS — Z79899 Other long term (current) drug therapy: Secondary | ICD-10-CM | POA: Insufficient documentation

## 2017-10-18 DIAGNOSIS — J45909 Unspecified asthma, uncomplicated: Secondary | ICD-10-CM | POA: Diagnosis not present

## 2017-10-18 DIAGNOSIS — F17210 Nicotine dependence, cigarettes, uncomplicated: Secondary | ICD-10-CM | POA: Diagnosis not present

## 2017-10-18 DIAGNOSIS — Z8249 Family history of ischemic heart disease and other diseases of the circulatory system: Secondary | ICD-10-CM | POA: Diagnosis not present

## 2017-10-18 DIAGNOSIS — Z7952 Long term (current) use of systemic steroids: Secondary | ICD-10-CM | POA: Insufficient documentation

## 2017-10-18 DIAGNOSIS — X58XXXA Exposure to other specified factors, initial encounter: Secondary | ICD-10-CM | POA: Insufficient documentation

## 2017-10-18 DIAGNOSIS — Z803 Family history of malignant neoplasm of breast: Secondary | ICD-10-CM | POA: Insufficient documentation

## 2017-10-18 DIAGNOSIS — Z880 Allergy status to penicillin: Secondary | ICD-10-CM | POA: Diagnosis not present

## 2017-10-18 DIAGNOSIS — S60221A Contusion of right hand, initial encounter: Secondary | ICD-10-CM

## 2017-10-18 DIAGNOSIS — S63601A Unspecified sprain of right thumb, initial encounter: Secondary | ICD-10-CM | POA: Insufficient documentation

## 2017-10-18 DIAGNOSIS — Z9851 Tubal ligation status: Secondary | ICD-10-CM | POA: Insufficient documentation

## 2017-10-18 DIAGNOSIS — W2209XA Striking against other stationary object, initial encounter: Secondary | ICD-10-CM | POA: Diagnosis not present

## 2017-10-18 NOTE — ED Provider Notes (Signed)
[facility] ____________________________________________  Time seen: Approximately 2:29 PM  I have reviewed the triage vital signs and the nursing notes.   HISTORY  Chief Complaint Hand Pain   HPI Sheryl Bernard is a 42 y.o. female presenting for evaluation of right thumb pain post injury that occurred yesterday.  Patient reports that she was in her car and a spider flew in and landed on her dash.  States in the process of trying to hit the spider she hit her thumb on the steering well causing the thumb to go backwards towards her wrist.  Reports pain since.  Reports did apply ice and wrap and Ace bandage, but reports pain continues with decreased range of motion.  Denies loss of sensation or other pain or injuries.  Reports otherwise feels well.  Pain is worse with direct palpation and active range of motion.  Denies other aggravating alleviating factors.  Reports right-hand dominant.  Past Medical History:  Diagnosis Date  . Anemia   . Anxiety   . Asthma     There are no active problems to display for this patient.   Past Surgical History:  Procedure Laterality Date  . TUBAL LIGATION       No current facility-administered medications for this encounter.   Current Outpatient Medications:  .  albuterol (PROVENTIL HFA;VENTOLIN HFA) 108 (90 Base) MCG/ACT inhaler, Inhale 2 puffs into the lungs every 4 (four) hours as needed for wheezing., Disp: 1 Inhaler, Rfl: 0 .  predniSONE (DELTASONE) 10 MG tablet, Start 60 mg po day one, then 50 mg po day two, taper by 10 mg daily until complete., Disp: 21 tablet, Rfl: 0  Allergies Bee venom; Penicillins; Tetracyclines & related; and Ciprofloxacin  Family History  Problem Relation Age of Onset  . Liver disease Mother   . Atrial fibrillation Mother   . Breast cancer Mother   . Hyperlipidemia Mother   . Congestive Heart Failure Father   . Vascular Disease Father     Social History Social History   Tobacco Use  .  Smoking status: Current Every Day Smoker    Packs/day: 0.50    Types: Cigarettes  . Smokeless tobacco: Never Used  Substance Use Topics  . Alcohol use: No  . Drug use: Yes    Types: Marijuana    Review of Systems Constitutional: No fever/chills Cardiovascular: Denies chest pain. Respiratory: Denies shortness of breath. Musculoskeletal: As above.  Skin: Negative for rash.   ____________________________________________   PHYSICAL EXAM:  VITAL SIGNS: ED Triage Vitals  Enc Vitals Group     BP 10/18/17 1331 111/75     Pulse Rate 10/18/17 1331 98     Resp 10/18/17 1331 17     Temp 10/18/17 1331 98.5 F (36.9 C)     Temp Source 10/18/17 1331 Oral     SpO2 10/18/17 1331 98 %     Weight 10/18/17 1330 200 lb (90.7 kg)     Height 10/18/17 1330 5\' 6"  (1.676 m)     Head Circumference --      Peak Flow --      Pain Score 10/18/17 1329 5     Pain Loc --      Pain Edu? --      Excl. in [place]? --     Constitutional: Alert and oriented. Well appearing and in no acute distress. ENT      Head: Normocephalic and atraumatic. Cardiovascular: Normal rate, regular rhythm. Grossly normal heart sounds.  Good peripheral circulation.  Respiratory: Normal respiratory effort without tachypnea nor retractions. Breath sounds are clear and equal bilaterally. No wheezes, rales, rhonchi. Musculoskeletal: Bilateral distal radial pulses equal and easily palpated. Except: Right hand thumb proximal phalanx and MCP joint mild to moderate tenderness to palpation with minimal localized swelling and pain in thumb fat pad, mild pain also at the base of second and third metacarpal, unable to thumb touch to fourth or fifth fingers with pain with thumb flexion and extension, normal distal sensation capillary refill, right hand otherwise nontender. Neurologic:  Normal speech and language. Speech is normal. No gait instability.  Skin:  Skin is warm, dry and intact. No rash noted. Psychiatric: Mood and affect are  normal. Speech and behavior are normal. Patient exhibits appropriate insight and judgment   ___________________________________________   LABS (all labs ordered are listed, but only abnormal results are displayed)   RADIOLOGY  Dg Hand Complete Right  Result Date: 10/18/2017 CLINICAL DATA:  Blunt trauma to right hand with thumb pain, initial encounter EXAM: RIGHT HAND - COMPLETE 3+ VIEW COMPARISON:  None. FINDINGS: There is no evidence of fracture or dislocation. There is no evidence of arthropathy or other focal bone abnormality. Soft tissues are unremarkable. IMPRESSION: No acute abnormality noted. Electronically Signed   By: Inez Catalina M.D.   On: 10/18/2017 14:49   ____________________________________________   PROCEDURES Procedures    INITIAL IMPRESSION / ASSESSMENT AND PLAN / ED COURSE  Pertinent labs & imaging results that were available during my care of the patient were reviewed by me and considered in my medical decision making (see chart for details).  Appearing patient.  No acute distress.  Right thumb pain post mechanical injury that occurred yesterday.  Right hand x-ray as above per radiologist reviewed by myself, no acute abnormality noted.  Suspect sprain injury with hyperextension.  Thumb spica splint given and directed for 3-day use for supportive care with intermittent stretching.  Ice, over-the-counter ibuprofen and follow-up as needed for continued pain with orthopedic.  Information given.  Discussed follow up with Primary care physician this week as needed. Discussed follow up and return parameters including no resolution or any worsening concerns. Patient verbalized understanding and agreed to plan.   ____________________________________________   FINAL CLINICAL IMPRESSION(S) / ED DIAGNOSES  Final diagnoses:  Sprain of right thumb, unspecified site of finger, initial encounter  Contusion of right hand, initial encounter     ED Discharge Orders    None         Note: This dictation was prepared with Dragon dictation along with smaller phrase technology. Any transcriptional errors that result from this process are unintentional.         Marylene Land, NP 10/18/17 1512

## 2017-10-18 NOTE — Discharge Instructions (Addendum)
Take over-the-counter ibuprofen as discussed.  Wear splint for 3 days for support.  Stretch hand and thumb.  Apply ice.  Follow-up with orthopedic as needed for continued pain.  Follow up with your primary care physician this week as needed. Return to Urgent care for new or worsening concerns.

## 2017-10-18 NOTE — ED Triage Notes (Signed)
Pt c/o right thumb pain. She was hitting a bug on her dash and injured her thumb. Injury occurred 3 days ago. She has been using cold and warm compresses and wrapping her wrist/thumb. She heard her thumb snap.

## 2017-11-15 DIAGNOSIS — S63641A Sprain of metacarpophalangeal joint of right thumb, initial encounter: Secondary | ICD-10-CM | POA: Insufficient documentation

## 2017-11-15 DIAGNOSIS — M79641 Pain in right hand: Secondary | ICD-10-CM | POA: Insufficient documentation

## 2018-02-02 ENCOUNTER — Encounter: Payer: Self-pay | Admitting: Emergency Medicine

## 2018-02-02 ENCOUNTER — Ambulatory Visit
Admission: EM | Admit: 2018-02-02 | Discharge: 2018-02-02 | Disposition: A | Discharge status: Home / Self Care | Payer: Medicaid Other | Attending: Emergency Medicine | Admitting: Emergency Medicine

## 2018-02-02 ENCOUNTER — Other Ambulatory Visit: Payer: Self-pay

## 2018-02-02 DIAGNOSIS — L089 Local infection of the skin and subcutaneous tissue, unspecified: Secondary | ICD-10-CM | POA: Diagnosis not present

## 2018-02-02 DIAGNOSIS — D234 Other benign neoplasm of skin of scalp and neck: Secondary | ICD-10-CM

## 2018-02-02 MED ORDER — CHLORHEXIDINE GLUCONATE 4 % EX LIQD: Freq: Every day | CUTANEOUS | 0 refills | Status: DC | PRN

## 2018-02-02 MED ORDER — HYDROXYZINE HCL 25 MG PO TABS: 25.0000 mg | ORAL_TABLET | Freq: Four times a day (QID) | ORAL | 0 refills | Status: AC | PRN

## 2018-02-02 MED ORDER — SULFAMETHOXAZOLE-TRIMETHOPRIM 800-160 MG PO TABS: 2.0000 | ORAL_TABLET | Freq: Two times a day (BID) | ORAL | 0 refills | Status: AC

## 2018-02-02 MED ORDER — MUPIROCIN 2 % EX OINT: 1.0000 "application " | TOPICAL_OINTMENT | Freq: Three times a day (TID) | CUTANEOUS | 0 refills | Status: DC

## 2018-02-02 NOTE — ED Triage Notes (Signed)
Patient c/o bump on her right neck that started 1 week ago. Patient states it is painful and itches. Patient stated she has tried OTC Lotrimin and Benadryl.

## 2018-02-02 NOTE — Discharge Instructions (Signed)
Finish the Bactrim, even if you feel better.  Bactroban is an antibiotic ointment which will help it heal.  Start taking some Claritin or Zyrtec for the itching.  Or you can use Atarax if the Claritin and Zyrtec are not working.  Here is a list of primary care providers who are taking new patients:  Dr. Otilio Miu, Dr. Adline Potter [address] [place] [address] [place] [address]  [phone]  [facility] [address] [place], [place] [address] [phone]  [facility] [place]  [phone] [place], [place] [address]  Here are clinics/ other resources who will see you if you do not have insurance. Some have certain criteria that you must meet. Call them and find out what they are:  [facility]: [address], [place], [place] [address] Phone: [phone] Hours: First and Third Saturdays of each Month, 9 a.m. - 1 p.m.  [facility]: [address], [place], [place], [place] [address] Phone: [phone] Hours: Tuesday, 4 p.m. - 8 p.m. Thursday, 1 p.m. - 8 p.m. Wednesday, 9 a.m. - [facility] [address] [place], [place] [address] Phone: [phone] Pharmacy Phone Number: [phone] Dental Phone Number: [phone] [place] Help: [phone]  Dental Hours: Monday - Thursday, 8 a.m. - 6 p.m.  [facility] [address], [place], [place] [address] Phone: [phone] Pharmacy Phone Number: [phone] [facility] Insurance Help: [phone]  [facility] [address] [place], [place] [address] Phone: [phone] Pharmacy Phone Number: [phone] [facility] Insurance Help: [phone]  [facility] [address] [place], [place] [address] Phone: [phone] [facility] Insurance Help: [phone]   [address]  [place], [place] [address] Phone: [phone]  Go to www.goodrx.com to look up your medications. This will give you a list of where you can find your prescriptions at the most affordable prices. Or ask the pharmacist what the cash price is, or if they have any other discount programs available to help make your medication more affordable. This can be less expensive than what you would pay with insurance.

## 2018-02-02 NOTE — ED Provider Notes (Signed)
HPI  SUBJECTIVE:  Sheryl Bernard is a 43 y.o. female who presents with an itchy "bump" on her right neck that she thinks started as a mosquito bite 1 week ago.  Patient states that she scratched her skin, and it is gotten bigger, has become painful. She states that she feels a "knot" in there.  No crusting, pus, fevers, neck stiffness.  No contacts with MRSA.  She tried Lotrimin, over-the-counter cortisone and unknown itch cream with worsening of her symptoms.  No alleviating factors.  She has a past medical history of abscesses/boils.  No known history of MRSA, diabetes.  LMP: Now.  Denies the possibility of being pregnant.  PMD: None.    Past Medical History:  Diagnosis Date  . Anemia   . Anxiety   . Asthma     Past Surgical History:  Procedure Laterality Date  . TUBAL LIGATION      Family History  Problem Relation Age of Onset  . Liver disease Mother   . Atrial fibrillation Mother   . Breast cancer Mother   . Hyperlipidemia Mother   . Congestive Heart Failure Father   . Vascular Disease Father     Social History   Tobacco Use  . Smoking status: Current Every Day Smoker    Packs/day: 0.50    Types: Cigarettes  . Smokeless tobacco: Never Used  Substance Use Topics  . Alcohol use: No  . Drug use: Yes    Types: Marijuana    No current facility-administered medications for this encounter.   Current Outpatient Medications:  .  albuterol (PROVENTIL HFA;VENTOLIN HFA) 108 (90 Base) MCG/ACT inhaler, Inhale 2 puffs into the lungs every 4 (four) hours as needed for wheezing., Disp: 1 Inhaler, Rfl: 0 .  chlorhexidine (HIBICLENS) 4 % external liquid, Apply topically daily as needed. Dilute 10-15 mL in water, Use daily when bathing for 1-2 weeks, Disp: 120 mL, Rfl: 0 .  hydrOXYzine (ATARAX/VISTARIL) 25 MG tablet, Take 1 tablet (25 mg total) by mouth every 6 (six) hours as needed for up to 10 days for itching., Disp: 30 tablet, Rfl: 0 .  mupirocin ointment (BACTROBAN) 2 %, Apply 1  application topically 3 (three) times daily., Disp: 22 g, Rfl: 0 .  sulfamethoxazole-trimethoprim (BACTRIM DS,SEPTRA DS) 800-160 MG tablet, Take 2 tablets by mouth 2 (two) times daily for 5 days., Disp: 20 tablet, Rfl: 0  Allergies  Allergen Reactions  . Bee Venom Anaphylaxis  . Penicillins Anaphylaxis    Has patient had a PCN reaction causing immediate rash, facial/tongue/throat swelling, SOB or lightheadedness with hypotension: Yes Has patient had a PCN reaction causing severe rash involving mucus membranes or skin necrosis: Yes Has patient had a PCN reaction that required hospitalization: Yes Has patient had a PCN reaction occurring within the last 10 years: Yes If all of the above answers are "NO", then may proceed with Cephalosporin use.   . Tetracyclines & Related Anaphylaxis  . Ciprofloxacin      ROS  As noted in HPI.   Physical Exam  BP 112/68 (BP Location: Left Arm)   Pulse 73   Temp 98.3 F (36.8 C) (Oral)   Resp (!) 22   Ht 5\' 6"  (1.676 m)   Wt 90.7 kg   SpO2 100%   BMI 32.28 kg/m   Constitutional: Well developed, well nourished, no acute distress Eyes:  EOMI, conjunctiva normal bilaterally HENT: Normocephalic, atraumatic,mucus membranes moist Respiratory: Normal inspiratory effort Cardiovascular: Normal rate GI: nondistended skin: Tender annular area  of erythema with no surrounding edema, erythema on right neck.  Measures approximately 1 to 2 cm.  No induration.  No central fluctuance.  No expressible purulent drainage.  See picture.   Lymph: No appreciable cervical lymphadenopathy Musculoskeletal: no deformities Neurologic: Alert & oriented x 3, no focal neuro deficits Psychiatric: Speech and behavior appropriate   ED Course   Medications - No data to display  No orders of the defined types were placed in this encounter.   No results found for this or any previous visit (from the past 24 hour(s)). No results found.  ED Clinical  Impression  Skin infection   ED Assessment/Plan  Reports anaphylaxis to penicillins and tetracycline so we will send home with Bactrim DS 2 tabs twice a day for 5 days, chlorhexidine soap, Bactroban.  Nothing to I&D today.  Advised her to try Claritin or Zyrtec for the itching.  Will provide a work note for today and tomorrow.  Also primary care referral list for ongoing care.   Discussed  MDM, treatment plan, and plan for follow-up with patient. patient agrees with plan.   Meds ordered this encounter  Medications  . sulfamethoxazole-trimethoprim (BACTRIM DS,SEPTRA DS) 800-160 MG tablet    Sig: Take 2 tablets by mouth 2 (two) times daily for 5 days.    Dispense:  20 tablet    Refill:  0  . mupirocin ointment (BACTROBAN) 2 %    Sig: Apply 1 application topically 3 (three) times daily.    Dispense:  22 g    Refill:  0  . chlorhexidine (HIBICLENS) 4 % external liquid    Sig: Apply topically daily as needed. Dilute 10-15 mL in water, Use daily when bathing for 1-2 weeks    Dispense:  120 mL    Refill:  0  . hydrOXYzine (ATARAX/VISTARIL) 25 MG tablet    Sig: Take 1 tablet (25 mg total) by mouth every 6 (six) hours as needed for up to 10 days for itching.    Dispense:  30 tablet    Refill:  0    *This clinic note was created using [employer]. Therefore, there may be occasional mistakes despite careful proofreading.   ?    Melynda Ripple, MD 02/02/18 1610

## 2018-03-14 ENCOUNTER — Ambulatory Visit
Admission: EM | Admit: 2018-03-14 | Discharge: 2018-03-14 | Disposition: A | Discharge status: Home / Self Care | Payer: Medicaid Other | Attending: Family Medicine | Admitting: Family Medicine

## 2018-03-14 ENCOUNTER — Other Ambulatory Visit: Payer: Self-pay

## 2018-03-14 ENCOUNTER — Encounter: Payer: Self-pay | Admitting: Emergency Medicine

## 2018-03-14 DIAGNOSIS — M5416 Radiculopathy, lumbar region: Secondary | ICD-10-CM | POA: Diagnosis not present

## 2018-03-14 MED ORDER — MELOXICAM 15 MG PO TABS: 15.0000 mg | ORAL_TABLET | Freq: Every day | ORAL | 0 refills | Status: DC

## 2018-03-14 MED ORDER — KETOROLAC TROMETHAMINE 60 MG/2ML IM SOLN
60.0000 mg | Freq: Once | INTRAMUSCULAR | Status: AC
  Administered 2018-03-14: 60 mg via INTRAMUSCULAR

## 2018-03-14 MED ORDER — TIZANIDINE HCL 4 MG PO CAPS: 4.0000 mg | ORAL_CAPSULE | Freq: Three times a day (TID) | ORAL | 0 refills | Status: DC

## 2018-03-14 NOTE — ED Provider Notes (Signed)
[facility]    CSN: [id number] Arrival date & time: 03/14/18  1142     History   Chief Complaint Chief Complaint  Patient presents with  . Back Pain    APPT    HPI Sheryl Bernard is a 43 y.o. female.   HPI  43 year old female presents with a one-week history of low back pain and left hip pain.  No known injury.  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  She states that she usually sleeps on the left side but now is not able to because she feels like she has a golf ball in that lateral hip.  That also while washing dishes she will find herself bent over at the waist supporting her upper torso with her arms trying to finish the dishes.  Had no radiation into her lower extremities other than the left hip.  She has had no incontinence.       Past Medical History:  Diagnosis Date  . Anemia   . Anxiety   . Asthma     There are no active problems to display for this patient.   Past Surgical History:  Procedure Laterality Date  . TUBAL LIGATION      OB History   No obstetric history on file.      Home Medications    Prior to Admission medications   Medication Sig Start Date End Date Taking? Authorizing Provider  albuterol (PROVENTIL HFA;VENTOLIN HFA) 108 (90 Base) MCG/ACT inhaler Inhale 2 puffs into the lungs every 4 (four) hours as needed for wheezing. 05/04/17  Yes Marylene Land, NP  meloxicam (MOBIC) 15 MG tablet Take 1 tablet (15 mg total) by mouth daily. 03/14/18   Lorin Picket, PA-C  tiZANidine (ZANAFLEX) 4 MG capsule Take 1 capsule (4 mg total) by mouth 3 (three) times daily. 03/14/18   Lorin Picket, PA-C    Family History Family History  Problem Relation Age of Onset  . Liver disease Mother   . Atrial fibrillation Mother   . Breast cancer Mother   . Hyperlipidemia Mother   . Congestive Heart Failure Father   . Vascular Disease Father     Social History Social History   Tobacco Use  .  Smoking status: Current Every Day Smoker    Packs/day: 0.50    Types: Cigarettes  . Smokeless tobacco: Never Used  Substance Use Topics  . Alcohol use: No  . Drug use: Yes    Types: Marijuana     Allergies   Bee venom; Penicillins; Tetracyclines & related; and Ciprofloxacin   Review of Systems Review of Systems  Constitutional: Positive for activity change. Negative for chills, fatigue and fever.  Musculoskeletal: Positive for arthralgias, back pain and myalgias.  All other systems reviewed and are negative.    Physical Exam Triage Vital Signs ED Triage Vitals  Enc Vitals Group     BP 03/14/18 1203 114/75     Pulse Rate 03/14/18 1203 73     Resp 03/14/18 1203 20     Temp 03/14/18 1203 98.7 F (37.1 C)     Temp src --      SpO2 03/14/18 1203 98 %     Weight 03/14/18 1201 200 lb (90.7 kg)     Height 03/14/18 1201 5\' 6"  (1.676 m)     Head Circumference --      Peak Flow --      Pain Score 03/14/18 1200 8  Pain Loc --      Pain Edu? --      Excl. in [place]? --    No data found.  Updated Vital Signs BP 114/75 (BP Location: Left Arm)   Pulse 73   Temp 98.7 F (37.1 C)   Resp 20   Ht 5\' 6"  (1.676 m)   Wt 200 lb (90.7 kg)   SpO2 98%   BMI 32.28 kg/m   Visual Acuity Right Eye Distance:   Left Eye Distance:   Bilateral Distance:    Right Eye Near:   Left Eye Near:    Bilateral Near:     Physical Exam Vitals signs and nursing note reviewed. Exam conducted with a chaperone present.  Constitutional:      General: She is not in acute distress.    Appearance: Normal appearance. She is obese. She is not ill-appearing, toxic-appearing or diaphoretic.  HENT:     Head: Normocephalic and atraumatic.     Nose: Nose normal.  Eyes:     General:        Right eye: No discharge.        Left eye: No discharge.     Conjunctiva/sclera: Conjunctivae normal.  Neck:     Musculoskeletal: Normal range of motion and neck supple.  Musculoskeletal:        General:  Tenderness present.     Comments: Family lower back shows a level pelvis in stance.  Patient is able to forward flex with her hands to the level of her knees.  Retaining an upright posture is more difficult with pain mostly in her right low back paraspinous muscles at the L4-L5 levels.  Flexion is good but with significant pain particularly to right lateral flexion.  Is able to toe and heel walk normally.  EHL peroneal and anterior tibialis muscles are strong to clinical testing.  DTRs 2+/4 and symmetrical.  Right leg raise testing on the right is normal.  Straight leg raise testing on the left positive at 90 degrees with low back pain and radiation of the pain into her left posterior thigh and hip area.  He said it was felt all the way down to her toes.  Is tender to palpation over the left hip laterally particularly over the posterior trochanteric area.  Skin:    General: Skin is warm and dry.  Neurological:     General: No focal deficit present.     Mental Status: She is alert and oriented to person, place, and time.  Psychiatric:        Mood and Affect: Mood normal.        Behavior: Behavior normal.        Thought Content: Thought content normal.        Judgment: Judgment normal.      UC Treatments / Results  Labs (all labs ordered are listed, but only abnormal results are displayed) Labs Reviewed - No data to display  EKG None  Radiology No results found.  Procedures Procedures (including critical care time)  Medications Ordered in UC Medications  ketorolac (TORADOL) injection 60 mg (60 mg Intramuscular Given 03/14/18 1323)    Initial Impression / Assessment and Plan / UC Course  I have reviewed the triage vital signs and the nursing notes.  Pertinent labs & imaging results that were available during my care of the patient were reviewed by me and considered in my medical decision making (see chart for details).   Patient exhibits a lumbar radiculopathy.  Treat her  conservatively at this point in time.  She has a follow-up appointment with her primary care physician in March and I will ask her to see if she can move that up with a cancellation.  Start her on Mobic that she will take daily and also a muscle relaxer.  Appropriate precautions will be given to her.   Final Clinical Impressions(s) / UC Diagnoses   Final diagnoses:  Left lumbar radiculopathy   Discharge Instructions   None    ED Prescriptions    Medication Sig Dispense Auth. Provider   meloxicam (MOBIC) 15 MG tablet Take 1 tablet (15 mg total) by mouth daily. 30 tablet Crecencio Mc P, PA-C   tiZANidine (ZANAFLEX) 4 MG capsule Take 1 capsule (4 mg total) by mouth 3 (three) times daily. 21 capsule Lorin Picket, PA-C     Controlled Substance Prescriptions [place] Controlled Substance Registry consulted? Not Applicable   Lorin Picket, PA-C 03/14/18 1340

## 2018-03-14 NOTE — Discharge Instructions (Signed)
Avoid symptoms as much as possible.Apply ice 20 minutes out of every 2 hours 4-5 times daily for comfort. Use  Caution while taking muscle relaxers.  Do not perform activities requiring concentration or judgment and do not drive.  Follow up at [facility] primary as soon as possible.

## 2018-03-14 NOTE — ED Triage Notes (Signed)
Patient states she is having bad back and hip pain x 1 week, patient states she has never had pain like this before.  Patient has an appointment with pcp on March 23

## 2018-04-18 DIAGNOSIS — T782XXA Anaphylactic shock, unspecified, initial encounter: Secondary | ICD-10-CM | POA: Insufficient documentation

## 2018-04-18 DIAGNOSIS — Z975 Presence of (intrauterine) contraceptive device: Secondary | ICD-10-CM | POA: Insufficient documentation

## 2018-04-18 DIAGNOSIS — Z87891 Personal history of nicotine dependence: Secondary | ICD-10-CM | POA: Insufficient documentation

## 2018-05-25 DIAGNOSIS — Z2821 Immunization not carried out because of patient refusal: Secondary | ICD-10-CM | POA: Insufficient documentation

## 2018-07-10 DIAGNOSIS — E669 Obesity, unspecified: Secondary | ICD-10-CM | POA: Insufficient documentation

## 2018-08-03 ENCOUNTER — Other Ambulatory Visit: Payer: Self-pay | Admitting: Family Medicine

## 2018-08-03 DIAGNOSIS — Z20822 Contact with and (suspected) exposure to covid-19: Secondary | ICD-10-CM

## 2018-08-08 LAB — NOVEL CORONAVIRUS, NAA: SARS-CoV-2, NAA: NOT DETECTED

## 2018-08-24 ENCOUNTER — Other Ambulatory Visit: Payer: Self-pay

## 2018-08-24 ENCOUNTER — Ambulatory Visit
Admission: RE | Admit: 2018-08-24 | Discharge: 2018-08-24 | Disposition: A | Discharge status: Home / Self Care | Payer: Medicaid Other | Source: Ambulatory Visit | Attending: Family Medicine | Admitting: Family Medicine

## 2018-08-24 ENCOUNTER — Ambulatory Visit
Admission: RE | Admit: 2018-08-24 | Discharge: 2018-08-24 | Disposition: A | Discharge status: Home / Self Care | Payer: Medicaid Other | Attending: Family Medicine | Admitting: Family Medicine

## 2018-08-24 ENCOUNTER — Other Ambulatory Visit: Payer: Self-pay | Admitting: Family Medicine

## 2018-08-24 DIAGNOSIS — M545 Low back pain, unspecified: Secondary | ICD-10-CM

## 2018-10-10 ENCOUNTER — Encounter (INDEPENDENT_AMBULATORY_CARE_PROVIDER_SITE_OTHER): Payer: Self-pay | Admitting: Vascular Surgery

## 2018-10-10 ENCOUNTER — Other Ambulatory Visit: Payer: Self-pay

## 2018-10-10 ENCOUNTER — Ambulatory Visit (INDEPENDENT_AMBULATORY_CARE_PROVIDER_SITE_OTHER): Payer: Medicaid Other | Admitting: Vascular Surgery

## 2018-10-10 DIAGNOSIS — M79605 Pain in left leg: Secondary | ICD-10-CM | POA: Diagnosis not present

## 2018-10-10 DIAGNOSIS — J45909 Unspecified asthma, uncomplicated: Secondary | ICD-10-CM | POA: Insufficient documentation

## 2018-10-10 DIAGNOSIS — I10 Essential (primary) hypertension: Secondary | ICD-10-CM | POA: Insufficient documentation

## 2018-10-10 DIAGNOSIS — D649 Anemia, unspecified: Secondary | ICD-10-CM | POA: Insufficient documentation

## 2018-10-10 DIAGNOSIS — M79604 Pain in right leg: Secondary | ICD-10-CM

## 2018-10-10 DIAGNOSIS — M79609 Pain in unspecified limb: Secondary | ICD-10-CM | POA: Insufficient documentation

## 2018-10-10 DIAGNOSIS — M7989 Other specified soft tissue disorders: Secondary | ICD-10-CM | POA: Insufficient documentation

## 2018-10-10 NOTE — Assessment & Plan Note (Signed)
A venous work-up is already planned.  Although her pain is not typical for atherosclerotic occlusive disease, she has a very strong family history at a young age and a long history of smoking so I think assessment for PAD is appropriate as well.  ABIs will be done in the near future at her convenience.

## 2018-10-10 NOTE — Assessment & Plan Note (Signed)
blood pressure control important in reducing the progression of atherosclerotic disease. On appropriate oral medications.  

## 2018-10-10 NOTE — Assessment & Plan Note (Addendum)
I have had a long discussion with the patient regarding swelling and why it  causes symptoms.  Patient will begin wearing graduated compression stockings class 1 (20-30 mmHg) on a daily basis. She has just started wearing these with some improvement. The patient will  beginning wearing the stockings first thing in the morning and removing them in the evening. The patient is instructed specifically not to sleep in the stockings.   In addition, behavioral modification will be initiated.  This will include frequent elevation, use of over the counter pain medications and exercise such as walking.  I have reviewed systemic causes for chronic edema such as liver, kidney and cardiac etiologies.  The patient denies problems with these organ systems.    Consideration for a lymph pump will also be made based upon the effectiveness of conservative therapy.  This would help to improve the edema control and prevent sequela such as ulcers and infections   Patient should undergo duplex ultrasound of the venous system to ensure that DVT or reflux is not present.  The patient will follow-up with me after the ultrasound.

## 2018-10-10 NOTE — Progress Notes (Signed)
Patient ID: Sheryl Bernard, female   DOB: 1975/02/24, 43 y.o.   MRN: [id number]  Chief Complaint  Patient presents with  . New Patient (Initial Visit)    HPI Sheryl Bernard is a 43 y.o. female.  I am asked to see the patient by E. White, NP for evaluation of leg pain and swelling.  This has been on and off issue for several years but has exacerbated over the past few months.  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  She has swelling in both legs but the right leg swells more.  The left leg hurts more.  Certain different activities and movements cause the left leg to hurt.  Ambulation is somewhat difficult but does not directly exacerbate the pain.  She reports no history of DVT or superficial thrombophlebitis to her knowledge.  She does have a history of a fibula fracture on the right leg some years ago.  She denies any open wounds or infection.  No chest pain or shortness of breath.  The pain starts in the thigh and radiates down the leg.  Certain activities seem to make it worse.  As for the swelling, she has recently started wearing compression stockings and that has improved the swelling somewhat.  She has been trying to elevate her legs as well..     Past Medical History:  Diagnosis Date  . Anemia   . Anxiety   . Asthma     Past Surgical History:  Procedure Laterality Date  . TUBAL LIGATION      Family History Family History  Problem Relation Age of Onset  . Liver disease Mother   . Atrial fibrillation Mother   . Breast cancer Mother   . Hyperlipidemia Mother   . Congestive Heart Failure Father   . Vascular Disease Father   Brother with PAD and amputation  Social History Quit smoking six months ago In Art gallery manager school currently No ETOH Occasional marijuana use  Allergies  Allergen Reactions  . Bee Venom Anaphylaxis  . Penicillins Anaphylaxis    Has patient had a PCN reaction causing immediate rash,  facial/tongue/throat swelling, SOB or lightheadedness with hypotension: Yes Has patient had a PCN reaction causing severe rash involving mucus membranes or skin necrosis: Yes Has patient had a PCN reaction that required hospitalization: Yes Has patient had a PCN reaction occurring within the last 10 years: Yes If all of the above answers are "NO", then may proceed with Cephalosporin use.   . Tetracycline Anaphylaxis  . Tetracyclines & Related Anaphylaxis  . Ciprofloxacin Rash    Current Outpatient Medications  Medication Sig Dispense Refill  . albuterol (PROVENTIL HFA;VENTOLIN HFA) 108 (90 Base) MCG/ACT inhaler Inhale 2 puffs into the lungs every 4 (four) hours as needed for wheezing. 1 Inhaler 0  . aspirin EC 81 MG tablet Take by mouth.    . EPINEPHrine 0.3 mg/0.3 mL IJ SOAJ injection INJECT 0.3 MLS (0.3 MG TOTAL) INTO THE MUSCLE ONCE AS NEEDED FOR ANAPHYLAXIS FOR UP TO 1 DOSE    . hydrochlorothiazide (HYDRODIURIL) 12.5 MG tablet Take by mouth.    . meloxicam (MOBIC) 15 MG tablet Take 1 tablet (15 mg total) by mouth daily. 30 tablet 0   No current facility-administered medications for this visit.       REVIEW OF SYSTEMS (Negative unless checked)  Constitutional: [] Weight loss  [] Fever  [] Chills Cardiac: [] Chest pain   [] Chest pressure   []   Palpitations   [] Shortness of breath when laying flat   [] Shortness of breath at rest   [x] Shortness of breath with exertion. Vascular:  [x] Pain in legs with walking   [x] Pain in legs at rest   [x] Pain in legs when laying flat   [] Claudication   [] Pain in feet when walking  [] Pain in feet at rest  [] Pain in feet when laying flat   [] History of DVT   [] Phlebitis   [x] Swelling in legs   [] Varicose veins   [] Non-healing ulcers Pulmonary:   [] Uses home oxygen   [] Productive cough   [] Hemoptysis   [] Wheeze  [] COPD   [x] Asthma Neurologic:  [] Dizziness  [] Blackouts   [] Seizures   [] History of stroke   [] History of TIA  [] Aphasia   [] Temporary blindness    [] Dysphagia   [] Weakness or numbness in arms   [] Weakness or numbness in legs Musculoskeletal:  [x] Arthritis   [] Joint swelling   [] Joint pain   [] Low back pain Hematologic:  [] Easy bruising  [] Easy bleeding   [] Hypercoagulable state   [x] Anemic  [] Hepatitis Gastrointestinal:  [] Blood in stool   [] Vomiting blood  [] Gastroesophageal reflux/heartburn   [] Abdominal pain Genitourinary:  [] Chronic kidney disease   [] Difficult urination  [] Frequent urination  [] Burning with urination   [] Hematuria Skin:  [] Rashes   [] Ulcers   [] Wounds Psychological:  [] History of anxiety   []  History of major depression.    Physical Exam BP 134/68 (BP Location: Left Arm, Patient Position: Sitting, Cuff Size: Normal)   Pulse 74   Resp 14   Ht 5\' 6"  (1.676 m)   Wt 238 lb (108 kg)   BMI 38.41 kg/m  Gen:  WD/WN, NAD Head: [place]/AT, No temporalis wasting.  Ear/Nose/Throat: Hearing grossly intact, nares w/o erythema or drainage, oropharynx w/o Erythema/Exudate Eyes: Conjunctiva clear, sclera non-icteric  Neck: trachea midline.  No JVD.  Pulmonary:  Good air movement, respirations not labored, no use of accessory muscles  Cardiac: RRR, no JVD Vascular:  Vessel Right Left  Radial Palpable Palpable                          DP 1+ 1+  PT 1+ Trace    Gastrointestinal:. No masses, surgical incisions, or scars. Musculoskeletal: M/S 5/5 throughout.  Extremities without ischemic changes.  No deformity or atrophy. 1+ BLE edema. Neurologic: Sensation grossly intact in extremities.  Symmetrical.  Speech is fluent. Motor exam as listed above. Psychiatric: Judgment intact, Mood & affect appropriate for pt's clinical situation. Dermatologic: No rashes or ulcers noted.  No cellulitis or open wounds.    Radiology No results found.  Labs Recent Results (from the past 2160 hour(s))  Novel Coronavirus, NAA ([facility])     Status: None   Collection Time: 08/03/18 11:15 AM  Result Value Ref Range   SARS-CoV-2, NAA Not  Detected Not Detected    Comment: Testing was performed using the cobas(R) SARS-CoV-2 test. This test was developed and its performance characteristics determined by [employer]. This test has not been FDA cleared or approved. This test has been authorized by FDA under an Emergency Use Authorization (EUA). This test is only authorized for the duration of time the declaration that circumstances exist justifying the authorization of the emergency use of in vitro diagnostic tests for detection of SARS-CoV-2 virus and/or diagnosis of COVID-19 infection under section 564(b)(1) of the Act, 21 U.S.C. KA:123727), unless the authorization is terminated or revoked sooner. When diagnostic testing is negative, the  possibility of a false negative result should be considered in the context of a patient's recent exposures and the presence of clinical signs and symptoms consistent with COVID-19. An individual without symptoms of COVID-19 and who is not shedding SARS-CoV-2 virus would expect to have a negati ve (not detected) result in this assay.     Assessment/Plan:  Essential hypertension blood pressure control important in reducing the progression of atherosclerotic disease. On appropriate oral medications.   Swelling of limb I have had a long discussion with the patient regarding swelling and why it  causes symptoms.  Patient will begin wearing graduated compression stockings class 1 (20-30 mmHg) on a daily basis. She has just started wearing these with some improvement. The patient will  beginning wearing the stockings first thing in the morning and removing them in the evening. The patient is instructed specifically not to sleep in the stockings.   In addition, behavioral modification will be initiated.  This will include frequent elevation, use of over the counter pain medications and exercise such as walking.  I have reviewed systemic causes for chronic edema such as liver,  kidney and cardiac etiologies.  The patient denies problems with these organ systems.    Consideration for a lymph pump will also be made based upon the effectiveness of conservative therapy.  This would help to improve the edema control and prevent sequela such as ulcers and infections   Patient should undergo duplex ultrasound of the venous system to ensure that DVT or reflux is not present.  The patient will follow-up with me after the ultrasound.    Pain in limb A venous work-up is already planned.  Although her pain is not typical for atherosclerotic occlusive disease, she has a very strong family history at a young age and a long history of smoking so I think assessment for PAD is appropriate as well.  ABIs will be done in the near future at her convenience.      Leotis Pain 10/10/2018, 12:43 PM   This note was created with Dragon medical transcription system.  Any errors from dictation are unintentional.

## 2018-10-10 NOTE — Patient Instructions (Signed)

## 2018-10-13 ENCOUNTER — Encounter (INDEPENDENT_AMBULATORY_CARE_PROVIDER_SITE_OTHER): Payer: Self-pay | Admitting: Nurse Practitioner

## 2018-10-13 ENCOUNTER — Ambulatory Visit (INDEPENDENT_AMBULATORY_CARE_PROVIDER_SITE_OTHER): Payer: Medicaid Other

## 2018-10-13 ENCOUNTER — Other Ambulatory Visit: Payer: Self-pay

## 2018-10-13 ENCOUNTER — Ambulatory Visit (INDEPENDENT_AMBULATORY_CARE_PROVIDER_SITE_OTHER): Payer: Medicaid Other | Admitting: Nurse Practitioner

## 2018-10-13 VITALS — BP 129/69 | HR 89 | Resp 16 | Wt 233.0 lb

## 2018-10-13 DIAGNOSIS — M79605 Pain in left leg: Secondary | ICD-10-CM

## 2018-10-13 DIAGNOSIS — M7989 Other specified soft tissue disorders: Secondary | ICD-10-CM | POA: Diagnosis not present

## 2018-10-13 DIAGNOSIS — I10 Essential (primary) hypertension: Secondary | ICD-10-CM | POA: Diagnosis not present

## 2018-10-13 DIAGNOSIS — Z87891 Personal history of nicotine dependence: Secondary | ICD-10-CM

## 2018-10-13 DIAGNOSIS — R600 Localized edema: Secondary | ICD-10-CM | POA: Diagnosis not present

## 2018-10-13 DIAGNOSIS — M79604 Pain in right leg: Secondary | ICD-10-CM | POA: Diagnosis not present

## 2018-10-13 DIAGNOSIS — E669 Obesity, unspecified: Secondary | ICD-10-CM

## 2018-10-16 ENCOUNTER — Encounter (INDEPENDENT_AMBULATORY_CARE_PROVIDER_SITE_OTHER): Payer: Self-pay | Admitting: Nurse Practitioner

## 2018-10-16 NOTE — Progress Notes (Signed)
SUBJECTIVE:  Patient ID: Sheryl Bernard, female    DOB: 1975-09-01, 43 y.o.   MRN: [id number] Chief Complaint  Patient presents with  . Follow-up    ultrasound follow up    HPI  Sheryl Bernard is a 43 y.o. female that returns today for follow-up of noninvasive studies due to bilateral lower extremity leg pain.  The patient describes her pain as a throbbing aching pain that shoots from her back down her legs.  She also states that the pain is heavily centered around her knees and sometimes in her groin.  She denies any pain when she is ambulating or any rest pain like symptoms.  She denies any lower extremity wounds.  The patient does endorse however that she recently stopped smoking around April or so about the time that quarantine began.  The patient also endorses that she gained approximately 60 pounds from February until now.  The patient works as a Building surveyor and she states that she previously had been in school however the last 3 months that she is come back is when the pain has been mostly unbearable.  The patient also notes that she recently had her Nexplanon removed about a week or so ago.  The patient underwent a venous reflux study which reveals no evidence of DVT bilaterally.  There is no evidence of superficial venous thrombosis bilaterally.  The right lower extremity has reflux within the common femoral vein and no chronic venous insufficiency within the left.  The patient also had an ABI of 1.22 on the right and 1.14 on the left.  The TBI was 0.99 on the right and 1.04 on the left.  The patient had biphasic waveforms within the bilateral tibial arteries.  Patient has strong toe waveforms bilaterally.  Past Medical History:  Diagnosis Date  . Anemia   . Anxiety   . Asthma     Past Surgical History:  Procedure Laterality Date  . TUBAL LIGATION      Social History   Socioeconomic History  . Marital status: Married    Spouse name: Not on file  . Number of children:  Not on file  . Years of education: Not on file  . Highest education level: Not on file  Occupational History  . Not on file  Social Needs  . Financial resource strain: Not on file  . Food insecurity    Worry: Not on file    Inability: Not on file  . Transportation needs    Medical: Not on file    Non-medical: Not on file  Tobacco Use  . Smoking status: Current Every Day Smoker    Packs/day: 0.50    Types: Cigarettes  . Smokeless tobacco: Never Used  Substance and Sexual Activity  . Alcohol use: No  . Drug use: Yes    Types: Marijuana  . Sexual activity: Not on file  Lifestyle  . Physical activity    Days per week: Not on file    Minutes per session: Not on file  . Stress: Not on file  Relationships  . Social Herbalist on phone: Not on file    Gets together: Not on file    Attends religious service: Not on file    Active member of club or organization: Not on file    Attends meetings of clubs or organizations: Not on file    Relationship status: Not on file  . Intimate partner violence    Fear of current or  ex partner: Not on file    Emotionally abused: Not on file    Physically abused: Not on file    Forced sexual activity: Not on file  Other Topics Concern  . Not on file  Social History Narrative  . Not on file    Family History  Problem Relation Age of Onset  . Liver disease Mother   . Atrial fibrillation Mother   . Breast cancer Mother   . Hyperlipidemia Mother   . Congestive Heart Failure Father   . Vascular Disease Father     Allergies  Allergen Reactions  . Bee Venom Anaphylaxis  . Penicillins Anaphylaxis    Has patient had a PCN reaction causing immediate rash, facial/tongue/throat swelling, SOB or lightheadedness with hypotension: Yes Has patient had a PCN reaction causing severe rash involving mucus membranes or skin necrosis: Yes Has patient had a PCN reaction that required hospitalization: Yes Has patient had a PCN reaction  occurring within the last 10 years: Yes If all of the above answers are "NO", then may proceed with Cephalosporin use.   . Tetracycline Anaphylaxis  . Tetracyclines & Related Anaphylaxis  . Ciprofloxacin Rash     Review of Systems   Review of Systems: Negative Unless Checked Constitutional: [] Weight loss  [] Fever  [] Chills Cardiac: [] Chest pain   []  Atrial Fibrillation  [] Palpitations   [] Shortness of breath when laying flat   [] Shortness of breath with exertion. [] Shortness of breath at rest Vascular:  [] Pain in legs with walking   [x] Pain in legs with standing [] Pain in legs when laying flat   [] Claudication    [] Pain in feet when laying flat    [] History of DVT   [] Phlebitis   [] Swelling in legs   [] Varicose veins   [] Non-healing ulcers Pulmonary:   [] Uses home oxygen   [] Productive cough   [] Hemoptysis   [] Wheeze  [] COPD   [] Asthma Neurologic:  [] Dizziness   [] Seizures  [] Blackouts [] History of stroke   [] History of TIA  [] Aphasia   [] Temporary Blindness   [] Weakness or numbness in arm   [x] Weakness or numbness in leg Musculoskeletal:   [] Joint swelling   [] Joint pain   [x] Low back pain  []  History of Knee Replacement [] Arthritis [] back Surgeries  []  Spinal Stenosis    Hematologic:  [] Easy bruising  [] Easy bleeding   [] Hypercoagulable state   [x] Anemic Gastrointestinal:  [] Diarrhea   [] Vomiting  [] Gastroesophageal reflux/heartburn   [] Difficulty swallowing. [] Abdominal pain Genitourinary:  [] Chronic kidney disease   [] Difficult urination  [] Anuric   [] Blood in urine [] Frequent urination  [] Burning with urination   [] Hematuria Skin:  [] Rashes   [] Ulcers [] Wounds Psychological:  [] History of anxiety   []  History of major depression  []  Memory Difficulties      OBJECTIVE:   Physical Exam  BP 129/69 (BP Location: Right Arm)   Pulse 89   Resp 16   Wt 233 lb (105.7 kg)   BMI 37.61 kg/m   Gen: WD/WN, NAD Head: [place]/AT, No temporalis wasting.  Ear/Nose/Throat: Hearing grossly intact,  nares w/o erythema or drainage Eyes: PER, EOMI, sclera nonicteric.  Neck: Supple, no masses.  No JVD.  Pulmonary:  Good air movement, no use of accessory muscles.  Cardiac: RRR Vascular:  1+ edema bilaterally Vessel Right Left  Radial Palpable Palpable  Dorsalis Pedis Palpable Palpable  Posterior Tibial Palpable Palpable   Gastrointestinal: soft, non-distended. No guarding/no peritoneal signs.  Musculoskeletal: M/S 5/5 throughout.  No deformity or atrophy.  Neurologic: Pain and  light touch intact in extremities.  Symmetrical.  Speech is fluent. Motor exam as listed above. Psychiatric: Judgment intact, Mood & affect appropriate for pt's clinical situation. Dermatologic: No Venous rashes. No Ulcers Noted.  No changes consistent with cellulitis. Lymph : No Cervical lymphadenopathy, no lichenification or skin changes of chronic lymphedema.       ASSESSMENT AND PLAN:  1. History of tobacco use The patient quit around April.  She continues to abstain.  However some people cessation of smoking is also cause for weight gain.  2. Essential hypertension Continue antihypertensive medications as already ordered, these medications have been reviewed and there are no changes at this time.   3. Swelling of limb Patient does have some chronic venous insufficiency in her right lower extremity which may also be due to an injury in that leg.  The patient is advised to utilize medical grade 1 compression stockings on a daily basis.  This will assist with her swelling and discomfort.  She is also advised to exercise at least 30 minutes daily and elevate her lower extremities when possible.  4. Pain in both lower extremities The patient does have some degree of peripheral artery disease.  However, at this time she is not experiencing any true manifestations of peripheral artery disease such as claudication-like symptoms.  Based on her noninvasive studies we will continue to follow the patient on an annual  basis.  She is advised to contact us sooner if she begins to have claudication-like symptoms or rest pain.  5. Obesity (BMI 35.0-39.9 without comorbidity) The patient gained an extensive amount of weight within a short period of time.  This is likely the contributory factor to her pain and discomfort.  The patient also went from being relatively sedentary to having to stand for long periods throughout the day.  The patient may have a component of sciatica which I have advised her to follow-up with her primary care with.  Also loss of weight will also help to decrease the pain discomfort she feels in her lower extremities.   Current Outpatient Medications on File Prior to Visit  Medication Sig Dispense Refill  . albuterol (PROVENTIL HFA;VENTOLIN HFA) 108 (90 Base) MCG/ACT inhaler Inhale 2 puffs into the lungs every 4 (four) hours as needed for wheezing. 1 Inhaler 0  . aspirin EC 81 MG tablet Take by mouth.    . EPINEPHrine 0.3 mg/0.3 mL IJ SOAJ injection INJECT 0.3 MLS (0.3 MG TOTAL) INTO THE MUSCLE ONCE AS NEEDED FOR ANAPHYLAXIS FOR UP TO 1 DOSE    . hydrochlorothiazide (HYDRODIURIL) 12.5 MG tablet Take by mouth.    . meloxicam (MOBIC) 15 MG tablet Take 1 tablet (15 mg total) by mouth daily. 30 tablet 0   No current facility-administered medications on file prior to visit.     There are no Patient Instructions on file for this visit. No follow-ups on file.   Kris Hartmann, NP  This note was completed with Sales executive.  Any errors are purely unintentional.

## 2018-10-31 ENCOUNTER — Ambulatory Visit
Admission: EM | Admit: 2018-10-31 | Discharge: 2018-10-31 | Disposition: A | Discharge status: Home / Self Care | Payer: Medicaid Other | Attending: Urgent Care | Admitting: Urgent Care

## 2018-10-31 ENCOUNTER — Other Ambulatory Visit: Payer: Self-pay

## 2018-10-31 ENCOUNTER — Encounter: Payer: Self-pay | Admitting: Emergency Medicine

## 2018-10-31 DIAGNOSIS — L02214 Cutaneous abscess of groin: Secondary | ICD-10-CM

## 2018-10-31 MED ORDER — LIDOCAINE HCL (PF) 1 % IJ SOLN
5.0000 mL | Freq: Once | INTRAMUSCULAR | Status: AC
  Administered 2018-10-31: 17:00:00 5 mL

## 2018-10-31 MED ORDER — SULFAMETHOXAZOLE-TRIMETHOPRIM 800-160 MG PO TABS: 1.0000 | ORAL_TABLET | Freq: Two times a day (BID) | ORAL | 0 refills | Status: AC

## 2018-10-31 MED ORDER — BACITRACIN ZINC 500 UNIT/GM EX OINT: 1.0000 "application " | TOPICAL_OINTMENT | Freq: Two times a day (BID) | CUTANEOUS | 0 refills | Status: DC

## 2018-10-31 NOTE — ED Provider Notes (Signed)
[place], [place]   Name: Sheryl Bernard DOB: 1975-03-13 MRN: [id number] CSN: [id number] PCP: Ricardo Jericho, NP  Arrival date and time:  10/31/18 1608  Chief Complaint:  Abscess (right groin)   NOTE: Prior to seeing the patient today, I have reviewed the triage nursing documentation and vital signs. Clinical staff has updated patient's PMH/PSHx, current medication list, and drug allergies/intolerances to ensure comprehensive history available to assist in medical decision making.   History:   HPI: Sheryl Bernard is a 43 y.o. female who presents today with complaints of an abscess in her RIGHT groin area that initially declared on Saturday. Patient notes that area has gotten larger and more painful since she first noticed it. She denies any associated drainage. Patient has not experienced any fevers. She denies a PMH (+) for recurrent abscesses.   Past Medical History:  Diagnosis Date  . Anemia   . Anxiety   . Asthma     Past Surgical History:  Procedure Laterality Date  . TUBAL LIGATION      Family History  Problem Relation Age of Onset  . Liver disease Mother   . Atrial fibrillation Mother   . Breast cancer Mother   . Hyperlipidemia Mother   . Congestive Heart Failure Father   . Vascular Disease Father     Social History   Tobacco Use  . Smoking status: Former Smoker    Packs/day: 0.50    Types: Cigarettes    Quit date: 04/2018    Years since quitting: 0.5  . Smokeless tobacco: Never Used  Substance Use Topics  . Alcohol use: No  . Drug use: Not Currently    Types: Marijuana    Patient Active Problem List   Diagnosis Date Noted  . Anemia 10/10/2018  . Asthma without status asthmaticus 10/10/2018  . Essential hypertension 10/10/2018  . Swelling of limb 10/10/2018  . Pain in limb 10/10/2018  . Obesity (BMI 35.0-39.9 without comorbidity) 07/10/2018  . Pneumococcal vaccination declined 05/25/2018  . Anaphylactic reaction, initial encounter 04/18/2018   . History of tobacco use 04/18/2018  . Nexplanon in place 04/18/2018  . Right hand pain 11/15/2017  . Sprain of ulnar collateral ligament of metacarpophalangeal (MCP) joint of right thumb 11/15/2017    Home Medications:    Current Meds  Medication Sig  . albuterol (PROVENTIL HFA;VENTOLIN HFA) 108 (90 Base) MCG/ACT inhaler Inhale 2 puffs into the lungs every 4 (four) hours as needed for wheezing.  Marland Kitchen aspirin EC 81 MG tablet Take by mouth.  . EPINEPHrine 0.3 mg/0.3 mL IJ SOAJ injection INJECT 0.3 MLS (0.3 MG TOTAL) INTO THE MUSCLE ONCE AS NEEDED FOR ANAPHYLAXIS FOR UP TO 1 DOSE  . hydrochlorothiazide (HYDRODIURIL) 12.5 MG tablet Take by mouth.    Allergies:   Bee venom, Penicillins, Tetracycline, Tetracyclines & related, and Ciprofloxacin  Review of Systems (ROS): Review of Systems  Constitutional: Negative for chills and fever.  Respiratory: Negative for cough and shortness of breath.   Cardiovascular: Negative for chest pain and palpitations.  Skin:       Abscess to RIGHT groin  All other systems reviewed and are negative.    Vital Signs: Today's Vitals   10/31/18 1626 10/31/18 1627 10/31/18 1629  BP:   (!) 108/92  Pulse:   73  Resp:   18  Temp:   98.5 F (36.9 C)  TempSrc:   Oral  SpO2:   98%  Weight:  238 lb (108 kg)   Height:  5\' 6"  (  1.676 m)   PainSc: 10-Worst pain ever      Physical Exam: Physical Exam  Constitutional: She is oriented to person, place, and time and well-developed, well-nourished, and in no distress.  HENT:  Head: Normocephalic and atraumatic.  Mouth/Throat: Mucous membranes are normal.  Neck: Normal range of motion. Neck supple.  Cardiovascular: Normal rate.  Pulmonary/Chest: Effort normal. No respiratory distress.  Lymphadenopathy:    She has no cervical adenopathy.  Neurological: She is alert and oriented to person, place, and time. Gait normal.  Skin: Skin is warm and dry. No rash noted.     Psychiatric: Mood, memory, affect and  judgment normal.  Nursing note and vitals reviewed.   Urgent Care Treatments / Results:   LABS: PLEASE NOTE: all labs that were ordered this encounter are listed, however only abnormal results are displayed. Labs Reviewed - No data to display  EKG: -None  RADIOLOGY: No results found.  PROCEDURES: Incision and Drainage  Date/Time: 10/31/2018 5:00 PM Performed by: Karen Kitchens, NP Authorized by: Karen Kitchens, NP   Consent:    Consent obtained:  Verbal   Consent given by:  Patient   Risks discussed:  Bleeding, incomplete drainage and infection   Alternatives discussed:  No treatment, delayed treatment, alternative treatment, observation and referral Location:    Type:  Abscess   Size:  3cm   Location: RIGHT groin. Pre-procedure details:    Skin preparation:  Betadine and antiseptic wash Anesthesia (see MAR for exact dosages):    Anesthesia method:  Local infiltration   Local anesthetic:  Lidocaine 1% w/o epi Procedure type:    Complexity:  Simple Procedure details:    Needle aspiration: no     Incision types:  Single straight   Incision depth:  Dermal   Scalpel blade:  11   Wound management:  Probed and deloculated, irrigated with saline and extensive cleaning   Drainage:  Bloody and purulent   Drainage amount:  Moderate   Wound treatment:  Wound left open   Packing materials:  None Post-procedure details:    Patient tolerance of procedure:  Tolerated well, no immediate complications Comments:     Procedure chaperoned by: Fransisca Connors, CMA    MEDICATIONS RECEIVED THIS VISIT: Medications  lidocaine (PF) (XYLOCAINE) 1 % injection 5 mL (5 mLs Infiltration Given 10/31/18 1721)    PERTINENT CLINICAL COURSE NOTES/UPDATES:   Initial Impression / Assessment and Plan / Urgent Care Course:  Pertinent labs & imaging results that were available during my care of the patient were personally reviewed by me and considered in my medical decision making (see lab/imaging section  of note for values and interpretations).  Sheryl Bernard is a 43 y.o. female who presents to [facility] today with complaints of Abscess (right groin)   Patient is well appearing overall in clinic today. She does not appear to be in any acute distress. Presenting symptoms (see HPI) and exam as documented above. Abscess drained as per above procedure note. Moderate sanguinopurulent contents. Most of the swelling appreciated by the patient is related to inflammation/induration. Wound cleansed and dressing in clinic by nursing staff. Will cover with topical (bacitracin) and oral antibiotics (SMZ-TMP DS) x 7 days. She was advised to keep area clean and dry. Discussed need to monitor for signs and symptoms of infection, which would include increased redness, swelling, streaking, drainage, pain, and the development of a fever.   Discussed follow up with primary care physician in 1 week for re-evaluation. I  have reviewed the follow up and strict return precautions for any new or worsening symptoms. Patient is aware of symptoms that would be deemed urgent/emergent, and would thus require further evaluation either here or in the emergency department. At the time of discharge, she verbalized understanding and consent with the discharge plan as it was reviewed with her. All questions were fielded by provider and/or clinic staff prior to patient discharge.    Final Clinical Impressions / Urgent Care Diagnoses:   Final diagnoses:  Abscess of groin, right    New Prescriptions:  [place] Controlled Substance Registry consulted? Not Applicable  Meds ordered this encounter  Medications  . sulfamethoxazole-trimethoprim (BACTRIM DS) 800-160 MG tablet    Sig: Take 1 tablet by mouth 2 (two) times daily for 7 days.    Dispense:  14 tablet    Refill:  0  . bacitracin ointment    Sig: Apply 1 application topically 2 (two) times daily.    Dispense:  14 g    Refill:  0  . lidocaine (PF) (XYLOCAINE) 1 % injection  5 mL    Recommended Follow up Care:  Patient encouraged to follow up with the following provider within the specified time frame, or sooner as dictated by the severity of her symptoms. As always, she was instructed that for any urgent/emergent care needs, she should seek care either here or in the emergency department for more immediate evaluation.  Follow-up Information    Ricardo Jericho, NP In 1 week.   Specialty: Family Medicine Why: General reassessment of symptoms if not improving Contact information: [place] [address] [phone]         NOTE: This note was prepared using Dragon dictation software along with smaller phrase technology. Despite my best ability to proofread, there is the potential that transcriptional errors may still occur from this process, and are completely unintentional.    Karen Kitchens, NP 10/31/18 1732

## 2018-10-31 NOTE — ED Triage Notes (Signed)
Pt has an abscess on her right groin that started about 3 days ago.

## 2018-10-31 NOTE — Discharge Instructions (Addendum)
It was very nice seeing you today in clinic. Thank you for entrusting me with your care.   Keep area clean and dry. Expect drainage. Keep area covered to prevent it from soiling your clothing. Warm compresses will help to promote continued drainage. Will treat with both topical and oral antibiotics. Monitor for signs and symptoms of infection, which would include increased redness, swelling, streaking, drainage, pain, and the development of a fever.   Make arrangements to follow up with your regular doctor in 1 week for re-evaluation if not improving. If your symptoms/condition worsens, please seek follow up care either here or in the ER. Please remember, our [place] providers are "right here with you" when you need [place].   Again, it was my pleasure to take care of you today. Thank you for choosing our clinic. I hope that you start to feel better quickly.   Honor Loh, MSN, APRN, FNP-C, CEN Advanced Practice Provider [facility]

## 2019-01-10 ENCOUNTER — Telehealth (INDEPENDENT_AMBULATORY_CARE_PROVIDER_SITE_OTHER): Payer: Self-pay

## 2019-01-10 ENCOUNTER — Encounter (INDEPENDENT_AMBULATORY_CARE_PROVIDER_SITE_OTHER): Payer: Self-pay

## 2019-01-10 NOTE — Telephone Encounter (Signed)
Can you give Ms. Buckwalter a call for me to find out more about her symptoms? We can see which tests would be better for her

## 2019-01-11 ENCOUNTER — Other Ambulatory Visit (INDEPENDENT_AMBULATORY_CARE_PROVIDER_SITE_OTHER): Payer: Self-pay | Admitting: Nurse Practitioner

## 2019-01-11 DIAGNOSIS — R252 Cramp and spasm: Secondary | ICD-10-CM

## 2019-01-12 ENCOUNTER — Ambulatory Visit (INDEPENDENT_AMBULATORY_CARE_PROVIDER_SITE_OTHER): Payer: Medicaid Other | Admitting: Nurse Practitioner

## 2019-01-12 ENCOUNTER — Other Ambulatory Visit: Payer: Self-pay

## 2019-01-12 ENCOUNTER — Encounter (INDEPENDENT_AMBULATORY_CARE_PROVIDER_SITE_OTHER): Payer: Medicaid Other

## 2019-01-12 ENCOUNTER — Ambulatory Visit (INDEPENDENT_AMBULATORY_CARE_PROVIDER_SITE_OTHER): Payer: Medicaid Other

## 2019-01-12 ENCOUNTER — Encounter (INDEPENDENT_AMBULATORY_CARE_PROVIDER_SITE_OTHER): Payer: Self-pay | Admitting: Nurse Practitioner

## 2019-01-12 VITALS — BP 111/70 | HR 56 | Resp 16 | Wt 235.8 lb

## 2019-01-12 DIAGNOSIS — M79604 Pain in right leg: Secondary | ICD-10-CM

## 2019-01-12 DIAGNOSIS — Z87891 Personal history of nicotine dependence: Secondary | ICD-10-CM

## 2019-01-12 DIAGNOSIS — R252 Cramp and spasm: Secondary | ICD-10-CM

## 2019-01-12 DIAGNOSIS — I10 Essential (primary) hypertension: Secondary | ICD-10-CM

## 2019-01-12 DIAGNOSIS — M79605 Pain in left leg: Secondary | ICD-10-CM

## 2019-01-15 ENCOUNTER — Encounter (INDEPENDENT_AMBULATORY_CARE_PROVIDER_SITE_OTHER): Payer: Medicaid Other

## 2019-01-15 ENCOUNTER — Ambulatory Visit (INDEPENDENT_AMBULATORY_CARE_PROVIDER_SITE_OTHER): Payer: Medicaid Other | Admitting: Nurse Practitioner

## 2019-01-21 ENCOUNTER — Encounter (INDEPENDENT_AMBULATORY_CARE_PROVIDER_SITE_OTHER): Payer: Self-pay | Admitting: Nurse Practitioner

## 2019-01-21 NOTE — Progress Notes (Signed)
SUBJECTIVE:  Patient ID: Sheryl Bernard, female    DOB: [birth year], 43 y.o.   MRN: [id number] Chief Complaint  Patient presents with  . Follow-up    ultrasound follow up    HPI  Sheryl Bernard is a 43 y.o. female that presents to the office today for urgent visit due to lower extremity cramping.  The patient reports having extreme bilateral lower extremity leg cramps that grown intensity to the point where she is unable to ambulate for long periods or do things such as driving.  Patient does report that she recently had blood work done by her PCP which did not show any electrolyte abnormalities.  The patient is concerned that she may have vascular issues causing these problems.  The patient states that this is mainly an issue because it affects her ability to go to school as well as her ability to work.  She denies any fever, chills, nausea, vomiting or diarrhea.  Today the patient underwent bilateral lower extremity DVT study which showed no evidence of DVT, chronic venous insufficiency or superficial venous thrombosis bilaterally.  Due to time constraints a limited lower extremity arterial duplex was done and showed no evidence of obstruction bilaterally.  Previous ABIs done in September showed mild PAD.  Past Medical History:  Diagnosis Date  . Anemia   . Anxiety   . Asthma     Past Surgical History:  Procedure Laterality Date  . TUBAL LIGATION      Social History   Socioeconomic History  . Marital status: Legally Separated    Spouse name: Not on file  . Number of children: Not on file  . Years of education: Not on file  . Highest education level: Not on file  Occupational History  . Not on file  Tobacco Use  . Smoking status: Former Smoker    Packs/day: 0.50    Types: Cigarettes    Quit date: 04/2018    Years since quitting: 0.7  . Smokeless tobacco: Never Used  Substance and Sexual Activity  . Alcohol use: No  . Drug use: Not Currently    Types: Marijuana  .  Sexual activity: Not on file  Other Topics Concern  . Not on file  Social History Narrative  . Not on file   Social Determinants of Health   Financial Resource Strain:   . Difficulty of Paying Living Expenses: Not on file  Food Insecurity:   . Worried About Charity fundraiser in the Last Year: Not on file  . Ran Out of Food in the Last Year: Not on file  Transportation Needs:   . Lack of Transportation (Medical): Not on file  . Lack of Transportation (Non-Medical): Not on file  Physical Activity:   . Days of Exercise per Week: Not on file  . Minutes of Exercise per Session: Not on file  Stress:   . Feeling of Stress : Not on file  Social Connections:   . Frequency of Communication with Friends and Family: Not on file  . Frequency of Social Gatherings with Friends and Family: Not on file  . Attends Religious Services: Not on file  . Active Member of Clubs or Organizations: Not on file  . Attends Archivist Meetings: Not on file  . Marital Status: Not on file  Intimate Partner Violence:   . Fear of Current or Ex-Partner: Not on file  . Emotionally Abused: Not on file  . Physically Abused: Not on file  . Sexually  Abused: Not on file    Family History  Problem Relation Age of Onset  . Liver disease Mother   . Atrial fibrillation Mother   . Breast cancer Mother   . Hyperlipidemia Mother   . Congestive Heart Failure Father   . Vascular Disease Father     Allergies  Allergen Reactions  . Bee Venom Anaphylaxis  . Penicillins Anaphylaxis    Has patient had a PCN reaction causing immediate rash, facial/tongue/throat swelling, SOB or lightheadedness with hypotension: Yes Has patient had a PCN reaction causing severe rash involving mucus membranes or skin necrosis: Yes Has patient had a PCN reaction that required hospitalization: Yes Has patient had a PCN reaction occurring within the last 10 years: Yes If all of the above answers are "NO", then may proceed with  Cephalosporin use.   . Tetracycline Anaphylaxis  . Tetracyclines & Related Anaphylaxis  . Magnesium Citrate Hives  . Ciprofloxacin Rash     Review of Systems   Review of Systems: Negative Unless Checked Constitutional: [] Weight loss  [] Fever  [] Chills Cardiac: [] Chest pain   []  Atrial Fibrillation  [] Palpitations   [] Shortness of breath when laying flat   [] Shortness of breath with exertion. [] Shortness of breath at rest Vascular:  [] Pain in legs with walking   [x] Pain in legs with standing [] Pain in legs when laying flat   [] Claudication    [] Pain in feet when laying flat    [] History of DVT   [] Phlebitis   [] Swelling in legs   [] Varicose veins   [] Non-healing ulcers Pulmonary:   [] Uses home oxygen   [] Productive cough   [] Hemoptysis   [] Wheeze  [] COPD   [] Asthma Neurologic:  [] Dizziness   [] Seizures  [] Blackouts [] History of stroke   [] History of TIA  [] Aphasia   [] Temporary Blindness   [] Weakness or numbness in arm   [x] Weakness or numbness in leg Musculoskeletal:   [] Joint swelling   [] Joint pain   [x] Low back pain  []  History of Knee Replacement [] Arthritis [x] back Surgeries  []  Spinal Stenosis    Hematologic:  [] Easy bruising  [] Easy bleeding   [] Hypercoagulable state   [] Anemic Gastrointestinal:  [] Diarrhea   [] Vomiting  [] Gastroesophageal reflux/heartburn   [] Difficulty swallowing. [] Abdominal pain Genitourinary:  [] Chronic kidney disease   [] Difficult urination  [] Anuric   [] Blood in urine [] Frequent urination  [] Burning with urination   [] Hematuria Skin:  [] Rashes   [] Ulcers [] Wounds Psychological:  [] History of anxiety   []  History of major depression  []  Memory Difficulties      OBJECTIVE:   Physical Exam  BP 111/70 (BP Location: Right Arm)   Pulse (!) 56   Resp 16   Wt 235 lb 12.8 oz (107 kg)   BMI 38.06 kg/m   Gen: WD/WN, NAD Head: [place]/AT, No temporalis wasting.  Ear/Nose/Throat: Hearing grossly intact, nares w/o erythema or drainage Eyes: PER, EOMI, sclera  nonicteric.  Neck: Supple, no masses.  No JVD.  Pulmonary:  Good air movement, no use of accessory muscles.  Cardiac: RRR Vascular:  +1 edema bilaterally Vessel Right Left  Radial Palpable Palpable  Dorsalis Pedis Palpable Palpable  Posterior Tibial Palpable Palpable   Gastrointestinal: soft, non-distended. No guarding/no peritoneal signs.  Musculoskeletal: M/S 5/5 throughout.  No deformity or atrophy.  Neurologic: Pain and light touch intact in extremities.  Symmetrical.  Speech is fluent. Motor exam as listed above. Psychiatric: Judgment intact, Mood & affect appropriate for pt's clinical situation. Dermatologic: No Venous rashes. No Ulcers Noted.  No  changes consistent with cellulitis. Lymph : No Cervical lymphadenopathy, no lichenification or skin changes of chronic lymphedema.       ASSESSMENT AND PLAN:  1. Pain in both lower extremities Recommend:  I do not find evidence of Vascular pathology that would explain the patient's symptoms  The patient has atypical pain symptoms for vascular disease  I do not find evidence of Vascular pathology that would explain the patient's symptoms and I suspect the patient is c/o pseudoclaudication.  Patient should have an evaluation of his LS spine which I defer to the primary service.  The patient also does note she has an upcoming appointment with her orthopedic doctor.  The patient may also want to consider a referral to neurology as well.  Noninvasive studies including venous ultrasound of the legs do not identify vascular problems  The patient should continue walking and begin a more formal exercise program. The patient should continue his antiplatelet therapy and aggressive treatment of the lipid abnormalities. The patient should begin wearing graduated compression socks 15-20 mmHg strength to control her mild edema.  The patient does have some evidence of mild PAD and therefore we will follow-up with her in 1 year.  Further work-up  of her lower extremity pain is deferred to the primary service     2. History of tobacco use Smoking cessation was discussed, 3-10 minutes spent on this topic specifically   3. Essential hypertension Continue antihypertensive medications as already ordered, these medications have been reviewed and there are no changes at this time.    Current Outpatient Medications on File Prior to Visit  Medication Sig Dispense Refill  . albuterol (PROVENTIL HFA;VENTOLIN HFA) 108 (90 Base) MCG/ACT inhaler Inhale 2 puffs into the lungs every 4 (four) hours as needed for wheezing. 1 Inhaler 0  . aspirin EC 81 MG tablet Take by mouth.    . EPINEPHrine 0.3 mg/0.3 mL IJ SOAJ injection INJECT 0.3 MLS (0.3 MG TOTAL) INTO THE MUSCLE ONCE AS NEEDED FOR ANAPHYLAXIS FOR UP TO 1 DOSE    . hydrochlorothiazide (HYDRODIURIL) 12.5 MG tablet Take by mouth.    . bacitracin ointment Apply 1 application topically 2 (two) times daily. 14 g 0   No current facility-administered medications on file prior to visit.    There are no Patient Instructions on file for this visit. No follow-ups on file.   Kris Hartmann, NP  This note was completed with Sales executive.  Any errors are purely unintentional.

## 2019-07-27 ENCOUNTER — Emergency Department
Admission: EM | Admit: 2019-07-27 | Discharge: 2019-07-27 | Disposition: A | Discharge status: Home / Self Care | Payer: Medicaid Other | Attending: Emergency Medicine | Admitting: Emergency Medicine

## 2019-07-27 ENCOUNTER — Other Ambulatory Visit: Payer: Self-pay

## 2019-07-27 DIAGNOSIS — R319 Hematuria, unspecified: Secondary | ICD-10-CM | POA: Diagnosis not present

## 2019-07-27 DIAGNOSIS — Z5321 Procedure and treatment not carried out due to patient leaving prior to being seen by health care provider: Secondary | ICD-10-CM | POA: Diagnosis not present

## 2019-07-27 DIAGNOSIS — R109 Unspecified abdominal pain: Secondary | ICD-10-CM | POA: Diagnosis present

## 2019-07-27 LAB — CBC
HCT: 36.2 % (ref 36.0–46.0)
Hemoglobin: 11.9 g/dL — ABNORMAL LOW (ref 12.0–15.0)
MCH: 28.5 pg (ref 26.0–34.0)
MCHC: 32.9 g/dL (ref 30.0–36.0)
MCV: 86.6 fL (ref 80.0–100.0)
Platelets: 425 10*3/uL — ABNORMAL HIGH (ref 150–400)
RBC: 4.18 MIL/uL (ref 3.87–5.11)
RDW: 14.6 % (ref 11.5–15.5)
WBC: 6.9 10*3/uL (ref 4.0–10.5)
nRBC: 0 % (ref 0.0–0.2)

## 2019-07-27 LAB — BASIC METABOLIC PANEL
Anion gap: 12 (ref 5–15)
BUN: 8 mg/dL (ref 6–20)
CO2: 22 mmol/L (ref 22–32)
Calcium: 8.7 mg/dL — ABNORMAL LOW (ref 8.9–10.3)
Chloride: 103 mmol/L (ref 98–111)
Creatinine, Ser: 0.73 mg/dL (ref 0.44–1.00)
GFR calc Af Amer: >60 mL/min (ref >60)
GFR calc non Af Amer: >60 mL/min (ref >60)
Glucose, Bld: 95 mg/dL (ref 70–99)
Potassium: 3.4 mmol/L — ABNORMAL LOW (ref 3.5–5.1)
Sodium: 137 mmol/L (ref 135–145)

## 2019-07-27 NOTE — ED Notes (Signed)
First Nurse Note: Pt to front desk to inquire about wait time. Pt informed that we cannot give out wait time. Pt states that she cannot keep waiting. Pt reports that she is cold and in pain. Pt was offered a blanket and RN also offered to get order from MD for pain medication. Pt states that she does not like to take medication because she is on a lot of medications already and that she has 2 blanket already and they are not helping. Pt asked if she can go home and we call her when it is her time to be seen. Pt informed that we cannot do that and if she leaves we will have to take her off the board. Pt was again offered pain medication and blankets. Pt declined and stated that she was going home.

## 2019-07-27 NOTE — ED Triage Notes (Signed)
Pt sent from [facility] for R flank pain. Pt extremely irritated in triage. Refusing triage quesions d/t having told situation at PCP and at [facility]. Pt continues to talk about how angry she is. Pt has paperwork with [employer]. R sided flank pain x 2 weeks with hematuria.

## 2019-10-16 ENCOUNTER — Encounter (INDEPENDENT_AMBULATORY_CARE_PROVIDER_SITE_OTHER): Payer: Medicaid Other

## 2019-10-16 ENCOUNTER — Ambulatory Visit (INDEPENDENT_AMBULATORY_CARE_PROVIDER_SITE_OTHER): Payer: Medicaid Other | Admitting: Vascular Surgery

## 2019-10-25 ENCOUNTER — Encounter (INDEPENDENT_AMBULATORY_CARE_PROVIDER_SITE_OTHER): Payer: Medicaid Other

## 2019-10-25 ENCOUNTER — Ambulatory Visit (INDEPENDENT_AMBULATORY_CARE_PROVIDER_SITE_OTHER): Payer: Medicaid Other | Admitting: Nurse Practitioner

## 2019-11-07 ENCOUNTER — Ambulatory Visit
Admission: EM | Admit: 2019-11-07 | Discharge: 2019-11-07 | Disposition: A | Discharge status: Home / Self Care | Payer: Medicaid Other | Attending: Emergency Medicine | Admitting: Emergency Medicine

## 2019-11-07 ENCOUNTER — Other Ambulatory Visit: Payer: Self-pay

## 2019-11-07 ENCOUNTER — Encounter: Payer: Self-pay | Admitting: Emergency Medicine

## 2019-11-07 DIAGNOSIS — S20461A Insect bite (nonvenomous) of right back wall of thorax, initial encounter: Secondary | ICD-10-CM

## 2019-11-07 DIAGNOSIS — W57XXXA Bitten or stung by nonvenomous insect and other nonvenomous arthropods, initial encounter: Secondary | ICD-10-CM | POA: Diagnosis not present

## 2019-11-07 MED ORDER — METHYLPREDNISOLONE SODIUM SUCC 125 MG IJ SOLR
125.0000 mg | Freq: Once | INTRAMUSCULAR | Status: AC
  Administered 2019-11-07: 125 mg via INTRAMUSCULAR

## 2019-11-07 MED ORDER — FAMOTIDINE 20 MG PO TABS: 20.0000 mg | ORAL_TABLET | Freq: Two times a day (BID) | ORAL | 0 refills | Status: DC

## 2019-11-07 MED ORDER — DIPHENHYDRAMINE HCL 50 MG PO CAPS
50.0000 mg | ORAL_CAPSULE | Freq: Once | ORAL | Status: AC
  Administered 2019-11-07: 50 mg via ORAL

## 2019-11-07 MED ORDER — CETIRIZINE HCL 10 MG PO TABS: 10.0000 mg | ORAL_TABLET | Freq: Every day | ORAL | 0 refills | Status: DC

## 2019-11-07 MED ORDER — PREDNISONE 10 MG (21) PO TBPK: ORAL_TABLET | Freq: Every day | ORAL | 0 refills | Status: DC

## 2019-11-07 NOTE — ED Triage Notes (Signed)
Patient states she believes she was stung on her back by yellow jackets about 30 minutes ago.

## 2019-11-07 NOTE — Discharge Instructions (Signed)
Take pepcid tonight.  Zyrtec and prednisone may be started tomorrow.  Cool compresses to area of back. Topical hydrocortisone if needed.  Return for any worsening of symptoms.

## 2019-11-07 NOTE — ED Provider Notes (Signed)
[facility]    CSN: [id number] Arrival date & time: 11/07/19  1654      History   Chief Complaint Chief Complaint  Patient presents with   Insect Bite    HPI Sheryl Bernard is a 44 y.o. female.   Sheryl Bernard presents with complaints of shortness of breath and concern for allergic response related to a bee sting. Her grandson was found on a hive/ covered in bees. She rushed to help him and was pulling bees off of him everywhere. She feels that she may have been stung to her right back which is painful. She feels chest tightness. She used her inhaler which has helped. This occurred immediately prior to arrival. 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 She feels that she has experienced some anxiety related to the situation as well.     ROS per HPI, negative if not otherwise mentioned.      Past Medical History:  Diagnosis Date   Anemia    Anxiety    Asthma     Patient Active Problem List   Diagnosis Date Noted   Anemia 10/10/2018   Asthma without status asthmaticus 10/10/2018   Essential hypertension 10/10/2018   Swelling of limb 10/10/2018   Pain in limb 10/10/2018   Obesity (BMI 35.0-39.9 without comorbidity) 07/10/2018   Pneumococcal vaccination declined 05/25/2018   Anaphylactic reaction, initial encounter 04/18/2018   History of tobacco use 04/18/2018   Nexplanon in place 04/18/2018   Right hand pain 11/15/2017   Sprain of ulnar collateral ligament of metacarpophalangeal (MCP) joint of right thumb 11/15/2017    Past Surgical History:  Procedure Laterality Date   TUBAL LIGATION      OB History   No obstetric history on file.      Home Medications    Prior to Admission medications   Medication Sig Start Date End Date Taking? Authorizing Provider  aspirin EC 81 MG tablet Take by mouth.   Yes [provider]  EPINEPHrine 0.3 mg/0.3 mL IJ SOAJ injection INJECT 0.3 MLS (0.3 MG TOTAL) INTO THE MUSCLE ONCE AS NEEDED FOR  ANAPHYLAXIS FOR UP TO 1 DOSE 09/20/18  Yes [provider]  hydrochlorothiazide (HYDRODIURIL) 12.5 MG tablet Take by mouth. 09/19/18 11/07/19 Yes [provider]  albuterol (PROVENTIL HFA;VENTOLIN HFA) 108 (90 Base) MCG/ACT inhaler Inhale 2 puffs into the lungs every 4 (four) hours as needed for wheezing. 05/04/17   Marylene Land, NP  bacitracin ointment Apply 1 application topically 2 (two) times daily. 10/31/18   Karen Kitchens, NP  cetirizine (ZYRTEC) 10 MG tablet Take 1 tablet (10 mg total) by mouth daily. 11/07/19   Zigmund Gottron, NP  famotidine (PEPCID) 20 MG tablet Take 1 tablet (20 mg total) by mouth 2 (two) times daily. 11/07/19   Zigmund Gottron, NP  predniSONE (STERAPRED UNI-PAK 21 TAB) 10 MG (21) TBPK tablet Take by mouth daily. Per box instruction 11/07/19   Zigmund Gottron, NP    Family History Family History  Problem Relation Age of Onset   Liver disease Mother    Atrial fibrillation Mother    Breast cancer Mother    Hyperlipidemia Mother    Congestive Heart Failure Father    Vascular Disease Father     Social History Social History   Tobacco Use   Smoking status: Current Every Day Smoker    Packs/day: 0.50    Types: Cigarettes    Last attempt to quit: 04/2018    Years since quitting:  1.5   Smokeless tobacco: Never Used  Vaping Use   Vaping Use: Never used  Substance Use Topics   Alcohol use: No   Drug use: Not Currently    Types: Marijuana     Allergies   Bee venom, Penicillins, Tetracycline, Tetracyclines & related, Magnesium citrate, and Ciprofloxacin   Review of Systems Review of Systems   Physical Exam Triage Vital Signs ED Triage Vitals  Enc Vitals Group     BP 11/07/19 1710 (!) 123/91     Pulse Rate 11/07/19 1710 90     Resp 11/07/19 1710 20     Temp 11/07/19 1710 98.1 F (36.7 C)     Temp Source 11/07/19 1710 Oral     SpO2 11/07/19 1710 100 %     Weight 11/07/19 1710 230 lb (104.3 kg)     Height 11/07/19  1710 5\' 6"  (1.676 m)     Head Circumference --      Peak Flow --      Pain Score 11/07/19 1840 10     Pain Loc --      Pain Edu? --      Excl. in [place]? --    No data found.  Updated Vital Signs BP 112/83 (BP Location: Right Arm)    Pulse 70    Temp 98.1 F (36.7 C) (Oral)    Resp 18    Ht 5\' 6"  (1.676 m)    Wt 230 lb (104.3 kg)    SpO2 100%    BMI 37.12 kg/m   Visual Acuity Right Eye Distance:   Left Eye Distance:   Bilateral Distance:    Right Eye Near:   Left Eye Near:    Bilateral Near:     Physical Exam Constitutional:      General: She is not in acute distress.    Appearance: She is well-developed.  Cardiovascular:     Rate and Rhythm: Normal rate and regular rhythm.  Pulmonary:     Effort: Pulmonary effort is normal.     Breath sounds: Normal breath sounds. No wheezing or rhonchi.  Skin:    General: Skin is warm and dry.          Comments: Slightly raised area to left back without redness or swelling   Neurological:     Mental Status: She is alert and oriented to person, place, and time.      UC Treatments / Results  Labs (all labs ordered are listed, but only abnormal results are displayed) Labs Reviewed - No data to display  EKG   Radiology No results found.  Procedures Procedures (including critical care time)  Medications Ordered in UC Medications  methylPREDNISolone sodium succinate (SOLU-MEDROL) 125 mg/2 mL injection 125 mg (125 mg Intramuscular Given 11/07/19 1806)  diphenhydrAMINE (BENADRYL) capsule 50 mg (50 mg Oral Given 11/07/19 1807)    Initial Impression / Assessment and Plan / UC Course  I have reviewed the triage vital signs and the nursing notes.  Pertinent labs & imaging results that were available during my care of the patient were reviewed by me and considered in my medical decision making (see chart for details).     Vitals stable. Feels improved after benadryl and solumedrol here in clinic. No work of breathing.  Swallowing and speaking without difficulty. Return precautions provided. Patient verbalized understanding and agreeable to plan.   Final Clinical Impressions(s) / UC Diagnoses   Final diagnoses:  Insect bite of right back wall of thorax,  initial encounter     Discharge Instructions     Take pepcid tonight.  Zyrtec and prednisone may be started tomorrow.  Cool compresses to area of back. Topical hydrocortisone if needed.  Return for any worsening of symptoms.    ED Prescriptions    Medication Sig Dispense Auth. Provider   famotidine (PEPCID) 20 MG tablet Take 1 tablet (20 mg total) by mouth 2 (two) times daily. 30 tablet Augusto Gamble B, NP   cetirizine (ZYRTEC) 10 MG tablet Take 1 tablet (10 mg total) by mouth daily. 30 tablet Augusto Gamble B, NP   predniSONE (STERAPRED UNI-PAK 21 TAB) 10 MG (21) TBPK tablet Take by mouth daily. Per box instruction 21 tablet Zigmund Gottron, NP     PDMP not reviewed this encounter.   Zigmund Gottron, NP 11/07/19 [phone]

## 2019-11-11 ENCOUNTER — Encounter (INDEPENDENT_AMBULATORY_CARE_PROVIDER_SITE_OTHER): Payer: Self-pay

## 2019-11-12 NOTE — Telephone Encounter (Signed)
Can you please contact patient and get her re-scheduled for her missed appointment

## 2019-11-19 ENCOUNTER — Ambulatory Visit: Payer: Medicaid Other | Admitting: Physician Assistant

## 2019-11-19 ENCOUNTER — Telehealth: Payer: Self-pay | Admitting: Family Medicine

## 2019-11-19 ENCOUNTER — Other Ambulatory Visit: Payer: Self-pay

## 2019-11-19 DIAGNOSIS — A5402 Gonococcal vulvovaginitis, unspecified: Secondary | ICD-10-CM

## 2019-11-19 DIAGNOSIS — Z113 Encounter for screening for infections with a predominantly sexual mode of transmission: Secondary | ICD-10-CM

## 2019-11-19 MED ORDER — GENTAMICIN SULFATE 40 MG/ML IJ SOLN
240.0000 mg | Freq: Once | INTRAMUSCULAR | Status: AC
  Administered 2019-11-19: 240 mg via INTRAMUSCULAR

## 2019-11-19 NOTE — Progress Notes (Signed)
S:  Patient into clinic for treatment today.  States that she was told by RN at [facility] that she needed to come here for the treatment for her STI.  Reports that her provider at [facility] sent Rx to the pharmacy for shot and pills that she was to take to their office to get but the pharmacy did not have the medication for the shot and she was told to come here for the shots.  Patient reports allergy to PCN and TCN as well as bee stings and other things.  Declines any screening today and requests the shots she needs only. O:  WDWN female, NAD, A&O x 3, normal work of breathing.  Per text that patient shows me on her phone, she needs tx for GC. A/P:  1.  Patient needs Gentamicin 240 mg IM today and states she is heading to the pharmacy when she leaves here to pick up the Rx for Azithromycin and take it when she eats. The patient was dispensed Gentamincin today. I provided counseling today regarding the medication. We discussed the medication, the side effects and when to call clinic. Patient given the opportunity to ask questions. Questions answered.  2.  Injections given to patient and patient counseled about staying for observation but patient left clinic without observation, AMA. 3.  No sex for 7 days and until after partner completes treatment. 4.  Rec condoms with all sex. 5.  RTC prn.

## 2019-11-19 NOTE — Telephone Encounter (Signed)
Patient in building for 9:20am appointment.   Deri Fuelling, RN

## 2019-11-19 NOTE — Telephone Encounter (Signed)
Call to patient. No answer, LMOM to return call.   Deri Fuelling, RN

## 2019-11-19 NOTE — Telephone Encounter (Signed)
PATIENT SCHEDULED AN APPT FOR TODAY BUT SINCE I HAD TO CALL HER TO SEE IF WE CAN MOVE HER APPT DOWN SHE GOT UPSET AND WANTS TO SPEAK TO A NURSE BECAUSE SHE DOESN'T UNDERSTAND WHY SHE EVEN NEEDS AN APPT SHE JUST WANTS TO GET TREATED.

## 2019-11-21 ENCOUNTER — Encounter (INDEPENDENT_AMBULATORY_CARE_PROVIDER_SITE_OTHER): Payer: Self-pay | Admitting: Nurse Practitioner

## 2019-11-21 ENCOUNTER — Ambulatory Visit (INDEPENDENT_AMBULATORY_CARE_PROVIDER_SITE_OTHER): Payer: Medicaid Other

## 2019-11-21 ENCOUNTER — Other Ambulatory Visit: Payer: Self-pay

## 2019-11-21 ENCOUNTER — Ambulatory Visit (INDEPENDENT_AMBULATORY_CARE_PROVIDER_SITE_OTHER): Payer: Medicaid Other | Admitting: Nurse Practitioner

## 2019-11-21 VITALS — BP 139/75 | HR 91 | Resp 16 | Wt 217.0 lb

## 2019-11-21 DIAGNOSIS — R252 Cramp and spasm: Secondary | ICD-10-CM | POA: Diagnosis not present

## 2019-11-21 DIAGNOSIS — Z87891 Personal history of nicotine dependence: Secondary | ICD-10-CM

## 2019-11-21 DIAGNOSIS — I739 Peripheral vascular disease, unspecified: Secondary | ICD-10-CM

## 2019-11-21 DIAGNOSIS — I10 Essential (primary) hypertension: Secondary | ICD-10-CM | POA: Diagnosis not present

## 2019-11-26 ENCOUNTER — Encounter (INDEPENDENT_AMBULATORY_CARE_PROVIDER_SITE_OTHER): Payer: Self-pay | Admitting: Nurse Practitioner

## 2019-11-26 NOTE — Progress Notes (Signed)
Subjective:    Patient ID: Sheryl Bernard, female    DOB: September 08, 1975, 44 y.o.   MRN: [id number] Chief Complaint  Patient presents with  . Follow-up    ultrasound follow up    The patient returns to the office for followup and review of the noninvasive studies. There have been no interval changes in lower extremity symptoms. No interval shortening of the patient's claudication distance or development of rest pain symptoms. No new ulcers or wounds have occurred since the last visit.  There have been no significant changes to the patient's overall health care.  The patient denies amaurosis fugax or recent TIA symptoms. There are no recent neurological changes noted. The patient denies history of DVT, PE or superficial thrombophlebitis. The patient denies recent episodes of angina or shortness of breath.   ABI Rt=1.16 and Lt=1.16  (previous ABI's Rt=1.22 and Lt=1.14) Duplex ultrasound of the right tibial arteries reveals biphasic waveforms with biphasic/triphasic waveforms in the left tibial arteries.  The patient has good toe waveforms bilaterally.   Review of Systems  Cardiovascular: Negative for leg swelling.  All other systems reviewed and are negative.      Objective:   Physical Exam Vitals reviewed.  HENT:     Head: Normocephalic.  Cardiovascular:     Rate and Rhythm: Normal rate and regular rhythm.     Pulses: Normal pulses.  Pulmonary:     Effort: Pulmonary effort is normal.  Skin:    General: Skin is warm and dry.  Neurological:     Mental Status: She is alert and oriented to person, place, and time.  Psychiatric:        Mood and Affect: Mood normal.        Behavior: Behavior normal.        Thought Content: Thought content normal.        Judgment: Judgment normal.     BP 139/75 (BP Location: Right Arm)   Pulse 91   Resp 16   Wt 217 lb (98.4 kg)   BMI 35.02 kg/m   Past Medical History:  Diagnosis Date  . Anemia   . Anxiety   . Asthma     Social  History   Socioeconomic History  . Marital status: Legally Separated    Spouse name: Not on file  . Number of children: Not on file  . Years of education: Not on file  . Highest education level: Not on file  Occupational History  . Not on file  Tobacco Use  . Smoking status: Current Every Day Smoker    Packs/day: 0.50    Types: Cigarettes    Last attempt to quit: 04/2018    Years since quitting: 1.5  . Smokeless tobacco: Never Used  Vaping Use  . Vaping Use: Never used  Substance and Sexual Activity  . Alcohol use: No  . Drug use: Not Currently    Types: Marijuana  . Sexual activity: Not on file  Other Topics Concern  . Not on file  Social History Narrative  . Not on file   Social Determinants of Health   Financial Resource Strain:   . Difficulty of Paying Living Expenses: Not on file  Food Insecurity:   . Worried About Charity fundraiser in the Last Year: Not on file  . Ran Out of Food in the Last Year: Not on file  Transportation Needs:   . Lack of Transportation (Medical): Not on file  . Lack of Transportation (Non-Medical): Not  on file  Physical Activity:   . Days of Exercise per Week: Not on file  . Minutes of Exercise per Session: Not on file  Stress:   . Feeling of Stress : Not on file  Social Connections:   . Frequency of Communication with Friends and Family: Not on file  . Frequency of Social Gatherings with Friends and Family: Not on file  . Attends Religious Services: Not on file  . Active Member of Clubs or Organizations: Not on file  . Attends Archivist Meetings: Not on file  . Marital Status: Not on file  Intimate Partner Violence:   . Fear of Current or Ex-Partner: Not on file  . Emotionally Abused: Not on file  . Physically Abused: Not on file  . Sexually Abused: Not on file    Past Surgical History:  Procedure Laterality Date  . TUBAL LIGATION      Family History  Problem Relation Age of Onset  . Liver disease Mother   .  Atrial fibrillation Mother   . Breast cancer Mother   . Hyperlipidemia Mother   . Congestive Heart Failure Father   . Vascular Disease Father     Allergies  Allergen Reactions  . Bee Venom Anaphylaxis  . Penicillins Anaphylaxis    Has patient had a PCN reaction causing immediate rash, facial/tongue/throat swelling, SOB or lightheadedness with hypotension: Yes Has patient had a PCN reaction causing severe rash involving mucus membranes or skin necrosis: Yes Has patient had a PCN reaction that required hospitalization: Yes Has patient had a PCN reaction occurring within the last 10 years: Yes If all of the above answers are "NO", then may proceed with Cephalosporin use.   . Tetracycline Anaphylaxis  . Tetracyclines & Related Anaphylaxis  . Magnesium Citrate Hives  . Ciprofloxacin Rash    CBC Latest Ref Rng & Units 07/27/2019 09/21/2015  WBC 4.0 - 10.5 K/uL 6.9 6.4  Hemoglobin 12.0 - 15.0 g/dL 11.9(L) 13.8  Hematocrit 36 - 46 % 36.2 39.7  Platelets 150 - 400 K/uL 425(H) 335      CMP     Component Value Date/Time   NA 137 07/27/2019 1520   K 3.4 (L) 07/27/2019 1520   CL 103 07/27/2019 1520   CO2 22 07/27/2019 1520   GLUCOSE 95 07/27/2019 1520   BUN 8 07/27/2019 1520   CREATININE 0.73 07/27/2019 1520   CALCIUM 8.7 (L) 07/27/2019 1520   GFRNONAA >60 07/27/2019 1520   GFRAA >60 07/27/2019 1520     @COAG @  Radiology     Assessment & Plan:   1. PAD (peripheral artery disease) (HCC) Recommend:  I do not find evidence of life style limiting vascular disease. The patient specifically denies life style limitation.  Previous noninvasive studies including ABI's of the legs do not identify critical vascular problems.  However the patient does have numerous atherosclerotic risk factors in addition to early atherosclerotic changes.  The patient should continue walking and begin a more formal exercise program. The patient should continue his antiplatelet therapy and  aggressive treatment of the lipid abnormalities.  The patient should begin wearing graduated compression socks 15-20 mmHg strength to control her mild edema.  Patient will follow-up with me in 1 year   2. History of tobacco use Smoking cessation was discussed, 3-10 minutes spent on this topic specifically   3. Essential hypertension Continue antihypertensive medications as already ordered, these medications have been reviewed and there are no changes at this time.  Current Outpatient Medications on File Prior to Visit  Medication Sig Dispense Refill  . albuterol (PROVENTIL HFA;VENTOLIN HFA) 108 (90 Base) MCG/ACT inhaler Inhale 2 puffs into the lungs every 4 (four) hours as needed for wheezing. 1 Inhaler 0  . aspirin EC 81 MG tablet Take by mouth.    . bacitracin ointment Apply 1 application topically 2 (two) times daily. 14 g 0  . cetirizine (ZYRTEC) 10 MG tablet Take 1 tablet (10 mg total) by mouth daily. 30 tablet 0  . EPINEPHrine 0.3 mg/0.3 mL IJ SOAJ injection INJECT 0.3 MLS (0.3 MG TOTAL) INTO THE MUSCLE ONCE AS NEEDED FOR ANAPHYLAXIS FOR UP TO 1 DOSE    . famotidine (PEPCID) 20 MG tablet Take 1 tablet (20 mg total) by mouth 2 (two) times daily. 30 tablet 0  . predniSONE (STERAPRED UNI-PAK 21 TAB) 10 MG (21) TBPK tablet Take by mouth daily. Per box instruction 21 tablet 0  . hydrochlorothiazide (HYDRODIURIL) 12.5 MG tablet Take by mouth.     No current facility-administered medications on file prior to visit.    There are no Patient Instructions on file for this visit. No follow-ups on file.   Kris Hartmann, NP

## 2020-11-17 ENCOUNTER — Other Ambulatory Visit (INDEPENDENT_AMBULATORY_CARE_PROVIDER_SITE_OTHER): Payer: Self-pay | Admitting: Nurse Practitioner

## 2020-11-17 DIAGNOSIS — I739 Peripheral vascular disease, unspecified: Secondary | ICD-10-CM

## 2020-11-18 ENCOUNTER — Encounter: Payer: Self-pay | Admitting: Emergency Medicine

## 2020-11-18 ENCOUNTER — Ambulatory Visit
Admission: EM | Admit: 2020-11-18 | Discharge: 2020-11-18 | Disposition: A | Discharge status: Home / Self Care | Payer: Medicaid Other

## 2020-11-18 ENCOUNTER — Encounter (INDEPENDENT_AMBULATORY_CARE_PROVIDER_SITE_OTHER): Payer: Medicaid Other

## 2020-11-18 ENCOUNTER — Ambulatory Visit (INDEPENDENT_AMBULATORY_CARE_PROVIDER_SITE_OTHER): Payer: Medicaid Other | Admitting: Vascular Surgery

## 2020-11-18 ENCOUNTER — Other Ambulatory Visit: Payer: Self-pay

## 2020-11-18 DIAGNOSIS — M79632 Pain in left forearm: Secondary | ICD-10-CM

## 2020-11-18 DIAGNOSIS — W503XXA Accidental bite by another person, initial encounter: Secondary | ICD-10-CM

## 2020-11-18 DIAGNOSIS — Z23 Encounter for immunization: Secondary | ICD-10-CM | POA: Diagnosis not present

## 2020-11-18 MED ORDER — NAPROXEN 500 MG PO TABS: 500.0000 mg | ORAL_TABLET | Freq: Two times a day (BID) | ORAL | 0 refills | Status: DC

## 2020-11-18 MED ORDER — TETANUS-DIPHTH-ACELL PERTUSSIS 5-2.5-18.5 LF-MCG/0.5 IM SUSY
0.5000 mL | PREFILLED_SYRINGE | Freq: Once | INTRAMUSCULAR | Status: AC
Start: 2020-11-18 — End: 2020-11-18
  Administered 2020-11-18: 0.5 mL via INTRAMUSCULAR

## 2020-11-18 NOTE — Discharge Instructions (Signed)
Please monitor for signs of infection. This includes redness, hot sensation, worsening swelling and pain, drainage of pus.  If these develop then we will need to use 2 different kinds of antibiotics to address infections that have been bites.  These would be Bactrim and clindamycin due to your allergies to penicillins, doxycycline and ciprofloxacin.  In the meantime use naproxen for pain and inflammation.  Use icing to the area with a barrier.  Apply ice for 20 minutes then take a break for 2 hours before reapplying it again in the same cycles.
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
## 2020-11-27 ENCOUNTER — Encounter (INDEPENDENT_AMBULATORY_CARE_PROVIDER_SITE_OTHER): Payer: Medicaid Other

## 2020-11-27 ENCOUNTER — Ambulatory Visit (INDEPENDENT_AMBULATORY_CARE_PROVIDER_SITE_OTHER): Payer: Medicaid Other | Admitting: Nurse Practitioner

## 2020-12-02 ENCOUNTER — Ambulatory Visit (INDEPENDENT_AMBULATORY_CARE_PROVIDER_SITE_OTHER): Payer: Medicaid Other | Admitting: Nurse Practitioner

## 2020-12-02 ENCOUNTER — Encounter (INDEPENDENT_AMBULATORY_CARE_PROVIDER_SITE_OTHER): Payer: Self-pay | Admitting: Nurse Practitioner

## 2020-12-02 ENCOUNTER — Other Ambulatory Visit: Payer: Self-pay

## 2020-12-02 ENCOUNTER — Ambulatory Visit (INDEPENDENT_AMBULATORY_CARE_PROVIDER_SITE_OTHER): Payer: Medicaid Other

## 2020-12-02 VITALS — BP 124/72 | HR 60 | Ht 66.0 in | Wt 187.0 lb

## 2020-12-02 DIAGNOSIS — I739 Peripheral vascular disease, unspecified: Secondary | ICD-10-CM

## 2020-12-02 DIAGNOSIS — I10 Essential (primary) hypertension: Secondary | ICD-10-CM

## 2020-12-02 DIAGNOSIS — Z87891 Personal history of nicotine dependence: Secondary | ICD-10-CM

## 2020-12-13 ENCOUNTER — Encounter (INDEPENDENT_AMBULATORY_CARE_PROVIDER_SITE_OTHER): Payer: Self-pay | Admitting: Nurse Practitioner

## 2020-12-13 NOTE — Progress Notes (Signed)
Subjective:    Patient ID: Sheryl Bernard, female    DOB: Jan 18, 1976, 45 y.o.   MRN: [id number] Chief Complaint  Patient presents with   Follow-up    17yr [place] FU    The patient returns to the office for followup and review of the noninvasive studies. There have been no interval changes in lower extremity symptoms. No interval shortening of the patient's claudication distance or development of rest pain symptoms. No new ulcers or wounds have occurred since the last visit.   There have been no significant changes to the patient's overall health care.   The patient denies amaurosis fugax or recent TIA symptoms. There are no recent neurological changes noted. The patient denies history of DVT, PE or superficial thrombophlebitis. The patient denies recent episodes of angina or shortness of breath.    ABI Rt=1.29 and Lt=1.36  (previous ABI's Rt=1.16 and Lt=1.16) Duplex ultrasound of the bilateral tibial arteries reveals triphasic waveforms with good toe waveforms bilaterally   Review of Systems  Cardiovascular:  Negative for leg swelling.  All other systems reviewed and are negative.     Objective:   Physical Exam Vitals reviewed.  HENT:     Head: Normocephalic.  Cardiovascular:     Rate and Rhythm: Normal rate.     Pulses: Decreased pulses.  Pulmonary:     Effort: Pulmonary effort is normal.  Skin:    General: Skin is warm and dry.  Neurological:     Mental Status: She is alert and oriented to person, place, and time.  Psychiatric:        Mood and Affect: Mood normal.        Behavior: Behavior normal.        Thought Content: Thought content normal.        Judgment: Judgment normal.    BP 124/72   Pulse 60   Ht 5\' 6"  (1.676 m)   Wt 187 lb (84.8 kg)   BMI 30.18 kg/m   Past Medical History:  Diagnosis Date   Anemia    Anxiety    Asthma     Social History   Socioeconomic History   Marital status: Legally Separated    Spouse name: Not on file   Number of  children: Not on file   Years of education: Not on file   Highest education level: Not on file  Occupational History   Not on file  Tobacco Use   Smoking status: Every Day    Packs/day: 0.50    Types: Cigarettes    Last attempt to quit: 04/2018    Years since quitting: 2.6   Smokeless tobacco: Never  Vaping Use   Vaping Use: Never used  Substance and Sexual Activity   Alcohol use: No   Drug use: Not Currently    Types: Marijuana   Sexual activity: Not on file  Other Topics Concern   Not on file  Social History Narrative   Not on file   Social Determinants of Health   Financial Resource Strain: Not on file  Food Insecurity: Not on file  Transportation Needs: Not on file  Physical Activity: Not on file  Stress: Not on file  Social Connections: Not on file  Intimate Partner Violence: Not on file    Past Surgical History:  Procedure Laterality Date   TUBAL LIGATION      Family History  Problem Relation Age of Onset   Liver disease Mother    Atrial fibrillation Mother  Breast cancer Mother    Hyperlipidemia Mother    Congestive Heart Failure Father    Vascular Disease Father     Allergies  Allergen Reactions   Bee Venom Anaphylaxis   Penicillins Anaphylaxis    Has patient had a PCN reaction causing immediate rash, facial/tongue/throat swelling, SOB or lightheadedness with hypotension: Yes Has patient had a PCN reaction causing severe rash involving mucus membranes or skin necrosis: Yes Has patient had a PCN reaction that required hospitalization: Yes Has patient had a PCN reaction occurring within the last 10 years: Yes If all of the above answers are "NO", then may proceed with Cephalosporin use.    Tetracycline Anaphylaxis   Tetracyclines & Related Anaphylaxis   Magnesium Citrate Hives   Tape Other (See Comments)    Feels like it burns skin   Ciprofloxacin Rash   Latex Rash    CBC Latest Ref Rng & Units 07/27/2019 09/21/2015  WBC 4.0 - 10.5 K/uL 6.9  6.4  Hemoglobin 12.0 - 15.0 g/dL 11.9(L) 13.8  Hematocrit 36.0 - 46.0 % 36.2 39.7  Platelets 150 - 400 K/uL 425(H) 335      CMP     Component Value Date/Time   NA 137 07/27/2019 1520   K 3.4 (L) 07/27/2019 1520   CL 103 07/27/2019 1520   CO2 22 07/27/2019 1520   GLUCOSE 95 07/27/2019 1520   BUN 8 07/27/2019 1520   CREATININE 0.73 07/27/2019 1520   CALCIUM 8.7 (L) 07/27/2019 1520   GFRNONAA >60 07/27/2019 1520   GFRAA >60 07/27/2019 1520     ABI WITH/WO TBI  Result Date: 12/02/2020  LOWER EXTREMITY DOPPLER STUDY Patient Name:  Sheryl Bernard  Date of Exam:   12/02/2020 Medical Rec #: [id number]      Accession #:    [id number] Date of Birth: Apr 24, 1975     Patient Gender: F Patient Age:   56 years Exam Location:  [facility] Procedure:      VAS [place] ABI WITH/WO TBI Referring Phys: --------------------------------------------------------------------------------  Indications: Rest pain.  Performing Technologist: Concha Norway RVT  Examination Guidelines: A complete evaluation includes at minimum, Doppler waveform signals and systolic blood pressure reading at the level of bilateral brachial, anterior tibial, and posterior tibial arteries, when vessel segments are accessible. Bilateral testing is considered an integral part of a complete examination. Photoelectric Plethysmograph (PPG) waveforms and toe systolic pressure readings are included as required and additional duplex testing as needed. Limited examinations for reoccurring indications may be performed as noted.  ABI Findings: +---------+------------------+-----+---------+--------+ Right    Rt Pressure (mmHg)IndexWaveform Comment  +---------+------------------+-----+---------+--------+ Brachial 100                                      +---------+------------------+-----+---------+--------+ ATA      119                    triphasic         +---------+------------------+-----+---------+--------+ PTA      129                1.29 triphasic         +---------+------------------+-----+---------+--------+ Great Toe100               1.00 Normal            +---------+------------------+-----+---------+--------+ +---------+------------------+-----+---------+-------+ Left     Lt Pressure (mmHg)IndexWaveform Comment +---------+------------------+-----+---------+-------+ ATA  131                    triphasic        +---------+------------------+-----+---------+-------+ PTA      136               1.36 triphasic        +---------+------------------+-----+---------+-------+ Great Toe86                0.86 Normal           +---------+------------------+-----+---------+-------+ +-------+-----------+-----------+------------+------------+ ABI/TBIToday's ABIToday's TBIPrevious ABIPrevious TBI +-------+-----------+-----------+------------+------------+ Right  1.29       1.00       1.16        .99          +-------+-----------+-----------+------------+------------+ Left   1.36       .86        1.16        1.01         +-------+-----------+-----------+------------+------------+ Bilateral ABIs and TBIs appear essentially unchanged compared to prior study on 10/2019.  Summary: Right: Resting right ankle-brachial index is within normal range. No evidence of significant right lower extremity arterial disease. The right toe-brachial index is normal. Left: Resting left ankle-brachial index is within normal range. No evidence of significant left lower extremity arterial disease. The left toe-brachial index is normal.  *See table(s) above for measurements and observations.  Electronically signed by Leotis Pain MD on 12/02/2020 at 3:27:27 PM.    Final        Assessment & Plan:   1. PAD (peripheral artery disease) (HCC) Recommend:   I do not find evidence of life style limiting vascular disease. The patient specifically denies life style limitation.   Previous noninvasive studies including ABI's  of the legs do not identify critical vascular problems.  However the patient does have numerous atherosclerotic risk factors in addition to early atherosclerotic changes.   The patient should continue walking and begin a more formal exercise program. The patient should continue his antiplatelet therapy and aggressive treatment of the lipid abnormalities.   The patient should begin wearing graduated compression socks 15-20 mmHg strength to control her mild edema.   Patient will follow-up with me in 1 year  2. Essential hypertension Continue antihypertensive medications as already ordered, these medications have been reviewed and there are no changes at this time.   3. History of tobacco use Smoking cessation was discussed, 3-10 minutes spent on this topic specifically    Current Outpatient Medications on File Prior to Visit  Medication Sig Dispense Refill   albuterol (PROVENTIL HFA;VENTOLIN HFA) 108 (90 Base) MCG/ACT inhaler Inhale 2 puffs into the lungs every 4 (four) hours as needed for wheezing. 1 Inhaler 0   aspirin EC 81 MG tablet Take by mouth.     bacitracin ointment Apply 1 application topically 2 (two) times daily. 14 g 0   cetirizine (ZYRTEC) 10 MG tablet Take 1 tablet (10 mg total) by mouth daily. 30 tablet 0   cetirizine (ZYRTEC) 10 MG tablet Take 1 tablet by mouth daily.     EPINEPHrine 0.3 mg/0.3 mL IJ SOAJ injection INJECT 0.3 MLS (0.3 MG TOTAL) INTO THE MUSCLE ONCE AS NEEDED FOR ANAPHYLAXIS FOR UP TO 1 DOSE     famotidine (PEPCID) 20 MG tablet Take 1 tablet (20 mg total) by mouth 2 (two) times daily. 30 tablet 0   famotidine (PEPCID) 20 MG tablet Take by mouth.     fluticasone (FLONASE) 50 MCG/ACT nasal  spray Place into the nose.     hydrochlorothiazide (HYDRODIURIL) 12.5 MG tablet Take by mouth.     ketorolac (TORADOL) 10 MG tablet Take 10 mg by mouth every 6 (six) hours as needed.     methocarbamol (ROBAXIN) 500 MG tablet Take 1,000 mg by mouth 4 (four) times daily.      naproxen (NAPROSYN) 500 MG tablet Take 1 tablet (500 mg total) by mouth 2 (two) times daily with a meal. 30 tablet 0   predniSONE (STERAPRED UNI-PAK 21 TAB) 10 MG (21) TBPK tablet Take by mouth daily. Per box instruction 21 tablet 0   solifenacin (VESICARE) 5 MG tablet Take by mouth.     tinidazole (TINDAMAX) 500 MG tablet Take 2,000 mg by mouth once.     hydrochlorothiazide (HYDRODIURIL) 12.5 MG tablet Take by mouth.     No current facility-administered medications on file prior to visit.    There are no Patient Instructions on file for this visit. No follow-ups on file.   Kris Hartmann, NP

## 2021-01-08 DIAGNOSIS — R102 Pelvic and perineal pain: Secondary | ICD-10-CM | POA: Insufficient documentation

## 2021-01-08 DIAGNOSIS — N3946 Mixed incontinence: Secondary | ICD-10-CM | POA: Insufficient documentation

## 2021-03-30 ENCOUNTER — Other Ambulatory Visit: Payer: Self-pay

## 2021-03-30 ENCOUNTER — Ambulatory Visit (INDEPENDENT_AMBULATORY_CARE_PROVIDER_SITE_OTHER): Payer: Medicaid Other

## 2021-03-30 ENCOUNTER — Ambulatory Visit
Admission: EM | Admit: 2021-03-30 | Discharge: 2021-03-30 | Disposition: A | Discharge status: Home / Self Care | Payer: Medicaid Other | Attending: Student | Admitting: Student

## 2021-03-30 DIAGNOSIS — M7711 Lateral epicondylitis, right elbow: Secondary | ICD-10-CM

## 2021-03-30 NOTE — ED Triage Notes (Signed)
Pt here with C/O right elbow, went to PCP on 03/24/2021 was told tennis elbow and given exercise states it has got worst.  ?

## 2021-03-30 NOTE — Discharge Instructions (Addendum)
-  Your x-ray was normal, you have tennis elbow. ?-Continue rest, Tylenol/ibuprofen, ice, exercises, brace. ?-You will need to see an orthopedist, follow-up with your primary care for this.  You require a referral with your insurance. ? ?

## 2021-03-30 NOTE — ED Provider Notes (Signed)
?[place] ? ? ? ?CSN: [id number] ?Arrival date & time: 03/30/21  1817 ? ? ?  ? ?History   ?Chief Complaint ?Chief Complaint  ?Patient presents with  ? Elbow Pain  ? ? ?HPI ?Sheryl Bernard is a 46 y.o. female presenting with follow-up of tennis elbow.  Describes about 1 month of right lateral elbow pain.  Was seen by her PCP for this on 2/28, and diagnosed with tennis elbow, provided with a brace and exercises.  She states that the pain has only gotten worse since this visit.  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  States she is concerned that the arm is getting worse instead of better, and she thinks she might have a hairline fracture despite no trauma or falls. Requesting xray. She is right handed.  ? ?HPI ? ?Past Medical History:  ?Diagnosis Date  ? Anemia   ? Anxiety   ? Asthma   ? ? ?Patient Active Problem List  ? Diagnosis Date Noted  ? Anemia 10/10/2018  ? Asthma without status asthmaticus 10/10/2018  ? Essential hypertension 10/10/2018  ? Swelling of limb 10/10/2018  ? Pain in limb 10/10/2018  ? Obesity (BMI 35.0-39.9 without comorbidity) 07/10/2018  ? Pneumococcal vaccination declined 05/25/2018  ? Anaphylactic reaction, initial encounter 04/18/2018  ? History of tobacco use 04/18/2018  ? Right hand pain 11/15/2017  ? Sprain of ulnar collateral ligament of metacarpophalangeal (MCP) joint of right thumb 11/15/2017  ? ? ?Past Surgical History:  ?Procedure Laterality Date  ? TUBAL LIGATION    ? ? ?OB History   ?No obstetric history on file. ?  ? ? ? ?Home Medications   ? ?Prior to Admission medications   ?Medication Sig Start Date End Date Taking? Authorizing Provider  ?aspirin EC 81 MG tablet Take by mouth.   Yes [provider]  ?EPINEPHrine 0.3 mg/0.3 mL IJ SOAJ injection INJECT 0.3 MLS (0.3 MG TOTAL) INTO THE MUSCLE ONCE AS NEEDED FOR ANAPHYLAXIS FOR UP TO 1 DOSE 09/20/18  Yes [provider]  ?fluticasone (FLONASE) 50 MCG/ACT nasal spray Place  into the nose. 01/01/20  Yes [provider]  ?hydrochlorothiazide (HYDRODIURIL) 12.5 MG tablet Take by mouth. 05/19/20 05/19/21 Yes [provider]  ?ketorolac (TORADOL) 10 MG tablet Take 10 mg by mouth every 6 (six) hours as needed. 11/26/20  Yes [provider]  ?methocarbamol (ROBAXIN) 500 MG tablet Take 1,000 mg by mouth 4 (four) times daily. 10/12/20  Yes [provider]  ?solifenacin (VESICARE) 5 MG tablet Take by mouth. 06/20/20  Yes [provider]  ?tinidazole (TINDAMAX) 500 MG tablet Take 2,000 mg by mouth once. 09/30/20  Yes [provider]  ?hydrochlorothiazide (HYDRODIURIL) 12.5 MG tablet Take by mouth. 09/19/18 11/07/19  [provider]  ? ? ?Family History ?Family History  ?Problem Relation Age of Onset  ? Liver disease Mother   ? Atrial fibrillation Mother   ? Breast cancer Mother   ? Hyperlipidemia Mother   ? Congestive Heart Failure Father   ? Vascular Disease Father   ? ? ?Social History ?Social History  ? ?Tobacco Use  ? Smoking status: Every Day  ?  Types: Cigars  ? Smokeless tobacco: Never  ?Vaping Use  ? Vaping Use: Never used  ?Substance Use Topics  ? Alcohol use: No  ? Drug use: Yes  ?  Types: Marijuana  ? ? ? ?Allergies   ?Bee venom, Penicillins, Tetracycline, Tetracyclines & related,  Magnesium citrate, Tape, Ciprofloxacin, and Latex ? ? ?Review of Systems ?Review of Systems  ?Musculoskeletal:   ?     R elbow pain   ?All other systems reviewed and are negative. ? ? ?Physical Exam ?Triage Vital Signs ?ED Triage Vitals  ?Enc Vitals Group  ?   BP 03/30/21 1830 110/60  ?   Pulse Rate 03/30/21 1830 60  ?   Resp 03/30/21 1830 18  ?   Temp 03/30/21 1830 98.3 ?F (36.8 ?C)  ?   Temp Source 03/30/21 1830 Oral  ?   SpO2 03/30/21 1830 100 %  ?   Weight 03/30/21 1829 166 lb (75.3 kg)  ?   Height 03/30/21 1829 '5\' 5"'$  (1.651 m)  ?   Head Circumference --   ?   Peak Flow --   ?   Pain Score 03/30/21 1826 8  ?   Pain Loc --   ?   Pain Edu? --   ?   Excl.  in [place]? --   ? ?No data found. ? ?Updated Vital Signs ?BP 110/60 (BP Location: Left Arm)   Pulse 60   Temp 98.3 ?F (36.8 ?C) (Oral)   Resp 18   Ht '5\' 5"'$  (1.651 m)   Wt 166 lb (75.3 kg)   SpO2 100%   BMI 27.62 kg/m?  ? ?Visual Acuity ?Right Eye Distance:   ?Left Eye Distance:   ?Bilateral Distance:   ? ?Right Eye Near:   ?Left Eye Near:    ?Bilateral Near:    ? ?Physical Exam ?Vitals reviewed.  ?Constitutional:   ?   General: She is not in acute distress. ?   Appearance: Normal appearance. She is not ill-appearing.  ?HENT:  ?   Head: Normocephalic and atraumatic.  ?Pulmonary:  ?   Effort: Pulmonary effort is normal.  ?Musculoskeletal:  ?   Comments: R elbow - no skin changes or swelling. No bony tenderness or medial/lateral epicondyle or olecranon pain. TTP along lateral - extensor tendon. Pain elicited with flexion and extension elbow and with supination and pronation. Grip strength 3/5, sensation intact, no snuffbox tenderness. Radial pulse 2+, cap refill <2 seconds.   ?Neurological:  ?   General: No focal deficit present.  ?   Mental Status: She is alert and oriented to person, place, and time.  ?Psychiatric:     ?   Mood and Affect: Mood normal.     ?   Behavior: Behavior normal.     ?   Thought Content: Thought content normal.     ?   Judgment: Judgment normal.  ? ? ? ?UC Treatments / Results  ?Labs ?(all labs ordered are listed, but only abnormal results are displayed) ?Labs Reviewed - No data to display ? ?EKG ? ? ?Radiology ?DG Elbow Complete Right ? ?Result Date: 03/30/2021 ?CLINICAL DATA:  Lateral epicondylitis.  No trauma. EXAM: RIGHT ELBOW - COMPLETE 3+ VIEW COMPARISON:  None. FINDINGS: Mild peripheral medial compartment joint space narrowing and degenerative osteophytosis. No joint effusion. Normal bone mineralization. No acute fracture is seen. No dislocation. No enthesopathic change is visualized with attention to the lateral epicondyle. IMPRESSION:: IMPRESSION: 1. Very mild medial elbow  osteoarthritis. 2. 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 Electronically Signed   By: Yvonne Kendall M.D.   On: 03/30/2021 18:58   ? ?Procedures ?Procedures (including critical care time) ? ?Medications Ordered in  UC ?Medications - No data to display ? ?Initial Impression / Assessment and Plan / UC Course  ?I have reviewed the triage vital signs and the nursing notes. ? ?Pertinent labs & imaging results that were available during my care of the patient were reviewed by me and considered in my medical decision making (see chart for details). ? ?  ? ?This patient is a very pleasant 46 y.o. year old female presenting with lateral epicondylitis x1 months. Neurovascularly intact. 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 No bony tenderness; xray not medically necessary; she is insisting on xray; this was normal. Continue RICE, brace. F/u with PCP; she will require referral to ortho given medicaid insurance.  ? ?Final Clinical Impressions(s) / UC Diagnoses  ? ?Final diagnoses:  ?Lateral epicondylitis, right elbow  ? ? ? ?Discharge Instructions   ? ?  ?-Your x-ray was normal, you have tennis elbow. ?-Continue rest, Tylenol/ibuprofen, ice, exercises, brace. ?-You will need to see an orthopedist, follow-up with your primary care for this.  You require a referral with your insurance. ? ? ? ? ? ?ED Prescriptions   ?None ?  ? ?PDMP not reviewed this encounter. ?  ?Hazel Sams, PA-C ?03/30/21 1903 ? ?

## 2021-07-23 ENCOUNTER — Emergency Department: Payer: Medicaid Other

## 2021-07-23 ENCOUNTER — Emergency Department
Admission: EM | Admit: 2021-07-23 | Discharge: 2021-07-23 | Disposition: A | Discharge status: Other Acute Inpatient Hospital | Payer: Medicaid Other | Attending: Emergency Medicine | Admitting: Emergency Medicine

## 2021-07-23 ENCOUNTER — Observation Stay (HOSPITAL_COMMUNITY)
Admission: EM | Admit: 2021-07-23 | Discharge: 2021-07-24 | Disposition: A | Discharge status: Home / Self Care | Payer: Medicaid Other | Attending: Surgery | Admitting: Surgery

## 2021-07-23 ENCOUNTER — Encounter (HOSPITAL_COMMUNITY): Payer: Self-pay | Admitting: Emergency Medicine

## 2021-07-23 ENCOUNTER — Observation Stay (HOSPITAL_BASED_OUTPATIENT_CLINIC_OR_DEPARTMENT_OTHER): Payer: Medicaid Other

## 2021-07-23 ENCOUNTER — Other Ambulatory Visit: Payer: Self-pay

## 2021-07-23 DIAGNOSIS — M549 Dorsalgia, unspecified: Secondary | ICD-10-CM | POA: Diagnosis not present

## 2021-07-23 DIAGNOSIS — M25522 Pain in left elbow: Secondary | ICD-10-CM | POA: Diagnosis not present

## 2021-07-23 DIAGNOSIS — R079 Chest pain, unspecified: Secondary | ICD-10-CM | POA: Diagnosis not present

## 2021-07-23 DIAGNOSIS — Y92410 Unspecified street and highway as the place of occurrence of the external cause: Secondary | ICD-10-CM | POA: Diagnosis not present

## 2021-07-23 DIAGNOSIS — S299XXA Unspecified injury of thorax, initial encounter: Principal | ICD-10-CM | POA: Insufficient documentation

## 2021-07-23 DIAGNOSIS — J45909 Unspecified asthma, uncomplicated: Secondary | ICD-10-CM | POA: Insufficient documentation

## 2021-07-23 DIAGNOSIS — I312 Hemopericardium, not elsewhere classified: Secondary | ICD-10-CM | POA: Diagnosis present

## 2021-07-23 DIAGNOSIS — M25512 Pain in left shoulder: Secondary | ICD-10-CM | POA: Diagnosis not present

## 2021-07-23 DIAGNOSIS — S2600XA Unspecified injury of heart with hemopericardium, initial encounter: Secondary | ICD-10-CM | POA: Diagnosis not present

## 2021-07-23 DIAGNOSIS — Z7951 Long term (current) use of inhaled steroids: Secondary | ICD-10-CM | POA: Diagnosis not present

## 2021-07-23 DIAGNOSIS — F17290 Nicotine dependence, other tobacco product, uncomplicated: Secondary | ICD-10-CM | POA: Insufficient documentation

## 2021-07-23 DIAGNOSIS — Z91040 Latex allergy status: Secondary | ICD-10-CM | POA: Insufficient documentation

## 2021-07-23 DIAGNOSIS — M25552 Pain in left hip: Secondary | ICD-10-CM | POA: Insufficient documentation

## 2021-07-23 DIAGNOSIS — M25562 Pain in left knee: Secondary | ICD-10-CM | POA: Diagnosis not present

## 2021-07-23 DIAGNOSIS — Z7982 Long term (current) use of aspirin: Secondary | ICD-10-CM | POA: Diagnosis not present

## 2021-07-23 DIAGNOSIS — S20219A Contusion of unspecified front wall of thorax, initial encounter: Secondary | ICD-10-CM | POA: Diagnosis not present

## 2021-07-23 DIAGNOSIS — M542 Cervicalgia: Secondary | ICD-10-CM | POA: Insufficient documentation

## 2021-07-23 LAB — CBC
HCT: 37 % (ref 36.0–46.0)
Hemoglobin: 11.8 g/dL — ABNORMAL LOW (ref 12.0–15.0)
MCH: 29.1 pg (ref 26.0–34.0)
MCHC: 31.9 g/dL (ref 30.0–36.0)
MCV: 91.1 fL (ref 80.0–100.0)
Platelets: 363 10*3/uL (ref 150–400)
RBC: 4.06 MIL/uL (ref 3.87–5.11)
RDW: 14.6 % (ref 11.5–15.5)
WBC: 6.5 10*3/uL (ref 4.0–10.5)
nRBC: 0 % (ref 0.0–0.2)

## 2021-07-23 LAB — COMPREHENSIVE METABOLIC PANEL
ALT: 15 U/L (ref 0–44)
AST: 20 U/L (ref 15–41)
Albumin: 3.7 g/dL (ref 3.5–5.0)
Alkaline Phosphatase: 44 U/L (ref 38–126)
Anion gap: 6 (ref 5–15)
BUN: 12 mg/dL (ref 6–20)
CO2: 25 mmol/L (ref 22–32)
Calcium: 8.9 mg/dL (ref 8.9–10.3)
Chloride: 106 mmol/L (ref 98–111)
Creatinine, Ser: 0.63 mg/dL (ref 0.44–1.00)
GFR, Estimated: >60 mL/min (ref >60)
Glucose, Bld: 84 mg/dL (ref 70–99)
Potassium: 4.1 mmol/L (ref 3.5–5.1)
Sodium: 137 mmol/L (ref 135–145)
Total Bilirubin: 0.4 mg/dL (ref 0.3–1.2)
Total Protein: 6.6 g/dL (ref 6.5–8.1)

## 2021-07-23 LAB — HCG, QUANTITATIVE, PREGNANCY: hCG, Beta Chain, Quant, S: 1 m[IU]/mL (ref <5)

## 2021-07-23 MED ORDER — ONDANSETRON HCL 4 MG/2ML IJ SOLN
4.0000 mg | Freq: Once | INTRAMUSCULAR | Status: AC
Start: 2021-07-23 — End: 2021-07-23
  Administered 2021-07-23: 4 mg via INTRAVENOUS
  Filled 2021-07-23: qty 2

## 2021-07-23 MED ORDER — LACTATED RINGERS IV SOLN: INTRAVENOUS | Status: DC

## 2021-07-23 MED ORDER — IOHEXOL 300 MG/ML  SOLN
100.0000 mL | Freq: Once | INTRAMUSCULAR | Status: AC | PRN
  Administered 2021-07-23: 100 mL via INTRAVENOUS

## 2021-07-23 MED ORDER — OXYCODONE HCL 5 MG PO TABS: 5.0000 mg | ORAL_TABLET | ORAL | Status: DC | PRN

## 2021-07-23 MED ORDER — ONDANSETRON 4 MG PO TBDP: 4.0000 mg | ORAL_TABLET | Freq: Four times a day (QID) | ORAL | Status: DC | PRN

## 2021-07-23 MED ORDER — SODIUM CHLORIDE 0.9 % IV BOLUS: 125.0000 mL | Freq: Once | INTRAVENOUS | Status: DC

## 2021-07-23 MED ORDER — ONDANSETRON HCL 4 MG/2ML IJ SOLN: 4.0000 mg | Freq: Four times a day (QID) | INTRAMUSCULAR | Status: DC | PRN

## 2021-07-23 MED ORDER — HYDROMORPHONE HCL 1 MG/ML IJ SOLN
1.0000 mg | Freq: Once | INTRAMUSCULAR | Status: AC
  Administered 2021-07-23: 1 mg via INTRAVENOUS
  Filled 2021-07-23: qty 1

## 2021-07-23 MED ORDER — ACETAMINOPHEN 500 MG PO TABS
1000.0000 mg | ORAL_TABLET | Freq: Four times a day (QID) | ORAL | Status: DC
  Filled 2021-07-23: qty 2

## 2021-07-23 MED ORDER — MORPHINE SULFATE (PF) 4 MG/ML IV SOLN
4.0000 mg | Freq: Once | INTRAVENOUS | Status: AC
  Administered 2021-07-23: 4 mg via INTRAVENOUS
  Filled 2021-07-23: qty 1

## 2021-07-23 MED ORDER — DOCUSATE SODIUM 100 MG PO CAPS
100.0000 mg | ORAL_CAPSULE | Freq: Two times a day (BID) | ORAL | Status: DC
  Filled 2021-07-23: qty 1

## 2021-07-23 MED ORDER — IOHEXOL 350 MG/ML SOLN
75.0000 mL | Freq: Once | INTRAVENOUS | Status: AC | PRN
  Administered 2021-07-23: 75 mL via INTRAVENOUS

## 2021-07-23 MED ORDER — ENOXAPARIN SODIUM 30 MG/0.3ML IJ SOSY: 30.0000 mg | PREFILLED_SYRINGE | Freq: Two times a day (BID) | INTRAMUSCULAR | Status: DC

## 2021-07-23 MED ORDER — METHOCARBAMOL 500 MG PO TABS
1000.0000 mg | ORAL_TABLET | Freq: Three times a day (TID) | ORAL | Status: DC
  Filled 2021-07-23 (×2): qty 2

## 2021-07-23 MED ORDER — SODIUM CHLORIDE 0.9 % IV BOLUS (SEPSIS)
1000.0000 mL | Freq: Once | INTRAVENOUS | Status: AC
  Administered 2021-07-23: 1000 mL via INTRAVENOUS

## 2021-07-23 MED ORDER — METOPROLOL TARTRATE 5 MG/5ML IV SOLN
5.0000 mg | INTRAVENOUS | Status: AC
  Administered 2021-07-23: 5 mg via INTRAVENOUS
  Filled 2021-07-23: qty 5

## 2021-07-23 NOTE — Progress Notes (Signed)
3 IV's blown and patient is refusing another IV at this time. Fluid stopped.

## 2021-07-23 NOTE — Progress Notes (Signed)
Transition of Care [facility]) - CAGE-AID Screening   Patient Details  Name: Sheryl Bernard MRN: [id number] Date of Birth: 1975-10-10  Clinical Narrative:  Patient denies alcohol or any drug use except for occasional marijuana. Refuses any resources.  CAGE-AID Screening:    Have You Ever Felt You Ought to Cut Down on Your Drinking or Drug Use?: No Have People Annoyed You By Critizing Your Drinking Or Drug Use?: No Have You Felt Bad Or Guilty About Your Drinking Or Drug Use?: No Have You Ever Had a Drink or Used Drugs First Thing In The Morning to Steady Your Nerves or to Get Rid of a Hangover?: No CAGE-AID Score: 0  Substance Abuse Education Offered: No

## 2021-07-23 NOTE — ED Notes (Signed)
[facility] at bedside 
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
## 2021-07-23 NOTE — Progress Notes (Addendum)
This was a transfer from the ED around 1500 pm today.

## 2021-07-23 NOTE — ED Notes (Signed)
EMTALA reviewed by charge RN 
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
## 2021-07-23 NOTE — ED Notes (Signed)
Report given to [facility].
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
## 2021-07-24 LAB — HIV ANTIBODY (ROUTINE TESTING W REFLEX): HIV Screen 4th Generation wRfx: NONREACTIVE

## 2021-07-24 MED ORDER — ACETAMINOPHEN 500 MG PO TABS: 1000.0000 mg | ORAL_TABLET | Freq: Four times a day (QID) | ORAL | 1 refills | Status: AC

## 2021-07-24 MED ORDER — DICLOFENAC SODIUM 75 MG PO TBEC: 75.0000 mg | DELAYED_RELEASE_TABLET | Freq: Two times a day (BID) | ORAL | 0 refills | Status: AC

## 2021-07-24 MED ORDER — METHOCARBAMOL 750 MG PO TABS: 750.0000 mg | ORAL_TABLET | Freq: Four times a day (QID) | ORAL | 1 refills | Status: AC
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
## 2021-07-24 NOTE — Plan of Care (Signed)
Called regarding echo report with typo. Patients EF is 55-60%. She has normal LV function upon my review. Reader was contacted for an addendum.

## 2021-07-24 NOTE — Progress Notes (Signed)
Mobility Specialist Progress Note  Pt prepping for d/c home and requiring no further assistance. Mobility Specialist no longer required.  Holland Falling Mobility Specialist Phone Number [phone]
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
## 2021-07-24 NOTE — Progress Notes (Signed)
OT Cancellation Note  Patient Details Name: Sheryl Bernard MRN: [id number] DOB: 06/25/1975   Cancelled Treatment:    Reason Eval/Treat Not Completed: OT screened, no needs identified, will sign off (Pt going outside and planning for dc. No concerns for ADLs. Will sign off.)  [place], OTR/L Acute Rehab Office: [phone] 07/24/2021, 10:00 AM

## 2021-07-27 LAB — ECHOCARDIOGRAM COMPLETE
AR max vel: 3.17 cm2
AV Peak grad: 5 mmHg
Ao pk vel: 1.12 m/s
Area-P 1/2: 3.03 cm2
Calc EF: 58.8 %
Height: 65 in
S' Lateral: 3.3 cm
Single Plane A2C EF: 58.2 %
Single Plane A4C EF: 57.9 %
Weight: 2400 oz

## 2021-11-23 ENCOUNTER — Encounter (INDEPENDENT_AMBULATORY_CARE_PROVIDER_SITE_OTHER): Payer: Self-pay

## 2021-11-26 ENCOUNTER — Encounter: Payer: Self-pay | Admitting: *Deleted

## 2021-11-26 ENCOUNTER — Emergency Department: Payer: Medicaid Other

## 2021-11-26 ENCOUNTER — Other Ambulatory Visit: Payer: Self-pay

## 2021-11-26 DIAGNOSIS — R059 Cough, unspecified: Secondary | ICD-10-CM | POA: Insufficient documentation

## 2021-11-26 DIAGNOSIS — Z5321 Procedure and treatment not carried out due to patient leaving prior to being seen by health care provider: Secondary | ICD-10-CM | POA: Insufficient documentation

## 2021-11-26 DIAGNOSIS — R079 Chest pain, unspecified: Secondary | ICD-10-CM | POA: Insufficient documentation

## 2021-11-26 LAB — BASIC METABOLIC PANEL
Anion gap: 10 (ref 5–15)
BUN: 10 mg/dL (ref 6–20)
CO2: 24 mmol/L (ref 22–32)
Calcium: 8.9 mg/dL (ref 8.9–10.3)
Chloride: 102 mmol/L (ref 98–111)
Creatinine, Ser: 0.74 mg/dL (ref 0.44–1.00)
GFR, Estimated: >60 mL/min (ref >60)
Glucose, Bld: 91 mg/dL (ref 70–99)
Potassium: 4 mmol/L (ref 3.5–5.1)
Sodium: 136 mmol/L (ref 135–145)

## 2021-11-26 LAB — CBC
HCT: 41.7 % (ref 36.0–46.0)
Hemoglobin: 13.5 g/dL (ref 12.0–15.0)
MCH: 28.1 pg (ref 26.0–34.0)
MCHC: 32.4 g/dL (ref 30.0–36.0)
MCV: 86.7 fL (ref 80.0–100.0)
Platelets: 301 10*3/uL (ref 150–400)
RBC: 4.81 MIL/uL (ref 3.87–5.11)
RDW: 14.2 % (ref 11.5–15.5)
WBC: 4.1 10*3/uL (ref 4.0–10.5)
nRBC: 0 % (ref 0.0–0.2)

## 2021-11-26 LAB — TROPONIN I (HIGH SENSITIVITY): Troponin I (High Sensitivity): 5 ng/L (ref <18)

## 2021-11-26 NOTE — ED Triage Notes (Signed)
Pt has chest pain for 3 days.  Sx worse today.  Pt has cold sx.  Pt reports a cough.  Pt alert   speech clear.

## 2021-11-27 ENCOUNTER — Emergency Department
Admission: EM | Admit: 2021-11-27 | Discharge: 2021-11-27 | Discharge status: Against Medical Advice | Payer: Medicaid Other | Attending: Emergency Medicine | Admitting: Emergency Medicine

## 2021-11-27 NOTE — ED Notes (Signed)
No answer when called several times from lobby 

## 2021-11-30 ENCOUNTER — Telehealth: Payer: Self-pay | Admitting: Emergency Medicine

## 2021-11-30 NOTE — Telephone Encounter (Signed)
Called patient due to left emergency department before provider exam to inquire about condition and follow up plans. Patient says she still feel bad.  No fever now, but she has had one.  She is speaking in full sentences without difficulty.  Says she has been trying to contact her pcp dr Kym Groom without success.  I told her to continue to try.  And to have him review the tests done here.  I also told her that she really needs an exam--whether it is at pcp or here.

## 2021-12-14 ENCOUNTER — Other Ambulatory Visit (INDEPENDENT_AMBULATORY_CARE_PROVIDER_SITE_OTHER): Payer: Self-pay | Admitting: Nurse Practitioner

## 2021-12-14 DIAGNOSIS — Z9889 Other specified postprocedural states: Secondary | ICD-10-CM

## 2021-12-15 ENCOUNTER — Ambulatory Visit (INDEPENDENT_AMBULATORY_CARE_PROVIDER_SITE_OTHER): Payer: Medicaid Other | Admitting: Nurse Practitioner

## 2021-12-15 ENCOUNTER — Ambulatory Visit (INDEPENDENT_AMBULATORY_CARE_PROVIDER_SITE_OTHER): Payer: Medicaid Other

## 2022-01-11 ENCOUNTER — Ambulatory Visit (INDEPENDENT_AMBULATORY_CARE_PROVIDER_SITE_OTHER): Payer: Medicaid Other

## 2022-01-11 ENCOUNTER — Ambulatory Visit (INDEPENDENT_AMBULATORY_CARE_PROVIDER_SITE_OTHER): Payer: Medicaid Other | Admitting: Nurse Practitioner

## 2022-01-11 ENCOUNTER — Encounter (INDEPENDENT_AMBULATORY_CARE_PROVIDER_SITE_OTHER): Payer: Self-pay | Admitting: Nurse Practitioner

## 2022-01-11 VITALS — BP 125/80 | HR 65 | Resp 14 | Ht 66.0 in | Wt 180.0 lb

## 2022-01-11 DIAGNOSIS — Z9889 Other specified postprocedural states: Secondary | ICD-10-CM | POA: Diagnosis not present

## 2022-01-11 DIAGNOSIS — I739 Peripheral vascular disease, unspecified: Secondary | ICD-10-CM | POA: Diagnosis not present

## 2022-01-11 DIAGNOSIS — I10 Essential (primary) hypertension: Secondary | ICD-10-CM

## 2022-01-11 DIAGNOSIS — Z87891 Personal history of nicotine dependence: Secondary | ICD-10-CM | POA: Diagnosis not present

## 2022-01-25 ENCOUNTER — Encounter (INDEPENDENT_AMBULATORY_CARE_PROVIDER_SITE_OTHER): Payer: Self-pay | Admitting: Nurse Practitioner

## 2022-01-25 NOTE — Progress Notes (Signed)
Subjective:    Patient ID: Sheryl Bernard, female    DOB: 05/13/1975, 47 y.o.   MRN: [id number] Chief Complaint  Patient presents with   Follow-up    ultrasound    The patient returns to the office for followup and review of the noninvasive studies. There have been no interval changes in lower extremity symptoms. No interval shortening of the patient's claudication distance or development of rest pain symptoms. No new ulcers or wounds have occurred since the last visit.   There have been no significant changes to the patient's overall health care.   The patient denies amaurosis fugax or recent TIA symptoms. There are no recent neurological changes noted. The patient denies history of DVT, PE or superficial thrombophlebitis. The patient denies recent episodes of angina or shortness of breath.    ABI Rt=1.21 and Lt=1.20  (previous ABI's Rt=1.29 and Lt=1.36) Duplex ultrasound of the bilateral tibial arteries reveals triphasic waveforms with good toe waveforms bilaterally     Review of Systems  All other systems reviewed and are negative.      Objective:   Physical Exam Vitals reviewed.  HENT:     Head: Normocephalic.  Cardiovascular:     Rate and Rhythm: Normal rate.     Pulses:          Dorsalis pedis pulses are detected w/ Doppler on the right side and detected w/ Doppler on the left side.       Posterior tibial pulses are detected w/ Doppler on the right side and detected w/ Doppler on the left side.  Pulmonary:     Effort: Pulmonary effort is normal.  Skin:    General: Skin is warm and dry.  Neurological:     Mental Status: She is alert and oriented to person, place, and time.  Psychiatric:        Mood and Affect: Mood normal.        Behavior: Behavior normal.        Thought Content: Thought content normal.        Judgment: Judgment normal.     BP 125/80 (BP Location: Right Arm)   Pulse 65   Resp 14   Ht '5\' 6"'$  (1.676 m)   Wt 180 lb (81.6 kg)   BMI 29.05  kg/m   Past Medical History:  Diagnosis Date   Anemia    Anxiety    Asthma     Social History   Socioeconomic History   Marital status: Legally Separated    Spouse name: Not on file   Number of children: Not on file   Years of education: Not on file   Highest education level: Not on file  Occupational History   Not on file  Tobacco Use   Smoking status: Every Day    Types: Cigars   Smokeless tobacco: Never  Vaping Use   Vaping Use: Never used  Substance and Sexual Activity   Alcohol use: No   Drug use: Yes    Types: Marijuana   Sexual activity: Not on file  Other Topics Concern   Not on file  Social History Narrative   Not on file   Social Determinants of Health   Financial Resource Strain: Not on file  Food Insecurity: Not on file  Transportation Needs: Not on file  Physical Activity: Not on file  Stress: Not on file  Social Connections: Not on file  Intimate Partner Violence: Not on file    Past Surgical History:  Procedure Laterality Date   TUBAL LIGATION      Family History  Problem Relation Age of Onset   Liver disease Mother    Atrial fibrillation Mother    Breast cancer Mother    Hyperlipidemia Mother    Congestive Heart Failure Father    Vascular Disease Father     Allergies  Allergen Reactions   Bee Venom Anaphylaxis   Penicillins Anaphylaxis    Has patient had a PCN reaction causing immediate rash, facial/tongue/throat swelling, SOB or lightheadedness with hypotension: Yes Has patient had a PCN reaction causing severe rash involving mucus membranes or skin necrosis: Yes Has patient had a PCN reaction that required hospitalization: Yes Has patient had a PCN reaction occurring within the last 10 years: Yes If all of the above answers are "NO", then may proceed with Cephalosporin use.    Tetracycline Anaphylaxis   Tetracyclines & Related Anaphylaxis   Magnesium Citrate Hives   Tape Other (See Comments)    Feels like it burns skin    Ciprofloxacin Rash   Latex Rash       Latest Ref Rng & Units 11/26/2021   10:29 PM 07/23/2021    6:10 AM 07/27/2019    3:20 PM  CBC  WBC 4.0 - 10.5 K/uL 4.1  6.5  6.9   Hemoglobin 12.0 - 15.0 g/dL 13.5  11.8  11.9   Hematocrit 36.0 - 46.0 % 41.7  37.0  36.2   Platelets 150 - 400 K/uL 301  363  425       CMP     Component Value Date/Time   NA 136 11/26/2021 2229   K 4.0 11/26/2021 2229   CL 102 11/26/2021 2229   CO2 24 11/26/2021 2229   GLUCOSE 91 11/26/2021 2229   BUN 10 11/26/2021 2229   CREATININE 0.74 11/26/2021 2229   CALCIUM 8.9 11/26/2021 2229   PROT 6.6 07/23/2021 0610   ALBUMIN 3.7 07/23/2021 0610   AST 20 07/23/2021 0610   ALT 15 07/23/2021 0610   ALKPHOS 44 07/23/2021 0610   BILITOT 0.4 07/23/2021 0610   GFRNONAA >60 11/26/2021 2229   GFRAA >60 07/27/2019 1520     VAS [place] ABI WITH/WO TBI  Result Date: 01/11/2022  LOWER EXTREMITY DOPPLER STUDY Patient Name:  Sheryl Bernard  Date of Exam:   01/11/2022 Medical Rec #: [id number]      Accession #:    [id number] Date of Birth: [birth year]     Patient Gender: F Patient Age:   71 years Exam Location:  [facility] Procedure:      VAS [place] ABI WITH/WO TBI Referring Phys: --------------------------------------------------------------------------------  Indications: Peripheral artery disease.  Performing Technologist: Blondell Reveal RT, RDMS, RVT  Examination Guidelines: A complete evaluation includes at minimum, Doppler waveform signals and systolic blood pressure reading at the level of bilateral brachial, anterior tibial, and posterior tibial arteries, when vessel segments are accessible. Bilateral testing is considered an integral part of a complete examination. Photoelectric Plethysmograph (PPG) waveforms and toe systolic pressure readings are included as required and additional duplex testing as needed. Limited examinations for reoccurring indications may be performed as noted.  ABI Findings:  +---------+------------------+-----+-----------+-------+ Right    Rt Pressure (mmHg)IndexWaveform   Comment +---------+------------------+-----+-----------+-------+ Brachial 108                                       +---------+------------------+-----+-----------+-------+ PTA  127               1.18 multiphasic        +---------+------------------+-----+-----------+-------+ DP       131               1.21 multiphasic        +---------+------------------+-----+-----------+-------+ Great Toe103               0.95 Normal             +---------+------------------+-----+-----------+-------+ +---------+------------------+-----+-----------+-------+ Left     Lt Pressure (mmHg)IndexWaveform   Comment +---------+------------------+-----+-----------+-------+ Brachial 106                                       +---------+------------------+-----+-----------+-------+ PTA      122               1.13 multiphasic        +---------+------------------+-----+-----------+-------+ DP       130               1.20 multiphasic        +---------+------------------+-----+-----------+-------+ Great Toe96                0.89 Normal             +---------+------------------+-----+-----------+-------+ +-------+-----------+-----------+------------+------------+ ABI/TBIToday's ABIToday's TBIPrevious ABIPrevious TBI +-------+-----------+-----------+------------+------------+ Right  1.21       0.95       1.29        1.0          +-------+-----------+-----------+------------+------------+ Left   1.20       0.89       1.36        0.86         +-------+-----------+-----------+------------+------------+ Bilateral ABIs and TBIs appear essentially unchanged compared to prior study on 12/02/20.  Summary: Bilateral: Bilateral ankle-brachial indexes are within normal range. Bilateral toe-brachial indexes are within normal range.  *See table(s) above for measurements and  observations.  Electronically signed by Hortencia Pilar MD on 01/11/2022 at 3:27:21 PM.    Final        Assessment & Plan:   1. PAD (peripheral artery disease) (HCC) Recommend:   I do not find evidence of life style limiting vascular disease. The patient specifically denies life style limitation.   Previous noninvasive studies including ABI's of the legs do not identify critical vascular problems.  However the patient does have numerous atherosclerotic risk factors in addition to early atherosclerotic changes.   The patient should continue walking and begin a more formal exercise program. The patient should continue his antiplatelet therapy and aggressive treatment of the lipid abnormalities.   The patient should begin wearing graduated compression socks 15-20 mmHg strength to control her mild edema.   Patient will follow-up with me in 1 year  2. Essential hypertension Continue antihypertensive medications as already ordered, these medications have been reviewed and there are no changes at this time.  3. History of tobacco use Smoking cessation was discussed, 3-10 minutes spent on this topic specifically   Current Outpatient Medications on File Prior to Visit  Medication Sig Dispense Refill   albuterol (VENTOLIN HFA) 108 (90 Base) MCG/ACT inhaler Inhale 2 puffs into the lungs every 6 (six) hours as needed for wheezing or shortness of breath.     EPINEPHrine 0.3 mg/0.3 mL IJ SOAJ injection Inject 0.3 mg into the muscle as needed for anaphylaxis.  acetaminophen (TYLENOL) 500 MG tablet Take 2 tablets (1,000 mg total) by mouth every 6 (six) hours. (Patient not taking: Reported on 01/11/2022) 120 tablet 1   cetirizine (ZYRTEC) 10 MG tablet Take 10 mg by mouth daily. (Patient not taking: Reported on 01/11/2022)     diclofenac (VOLTAREN) 75 MG EC tablet Take 1 tablet (75 mg total) by mouth 2 (two) times daily. (Patient not taking: Reported on 01/11/2022) 60 tablet 0   methocarbamol  (ROBAXIN-750) 750 MG tablet Take 1 tablet (750 mg total) by mouth 4 (four) times daily. (Patient not taking: Reported on 01/11/2022) 120 tablet 1   No current facility-administered medications on file prior to visit.    There are no Patient Instructions on file for this visit. No follow-ups on file.   Kris Hartmann, NP

## 2023-01-11 ENCOUNTER — Other Ambulatory Visit (INDEPENDENT_AMBULATORY_CARE_PROVIDER_SITE_OTHER): Payer: Self-pay | Admitting: Nurse Practitioner

## 2023-01-11 DIAGNOSIS — I739 Peripheral vascular disease, unspecified: Secondary | ICD-10-CM

## 2023-01-12 ENCOUNTER — Encounter (INDEPENDENT_AMBULATORY_CARE_PROVIDER_SITE_OTHER): Payer: Medicaid Other

## 2023-01-12 ENCOUNTER — Ambulatory Visit (INDEPENDENT_AMBULATORY_CARE_PROVIDER_SITE_OTHER): Payer: Medicaid Other | Admitting: Nurse Practitioner

## 2023-05-12 IMAGING — CR DG ELBOW COMPLETE 3+V*R*
4 series · 4 of 4 positions shown · non-contrast
Comparison: None.

CLINICAL DATA: Lateral epicondylitis.  No trauma.

EXAM:
RIGHT ELBOW - COMPLETE 3+ VIEW

[elbow ap]
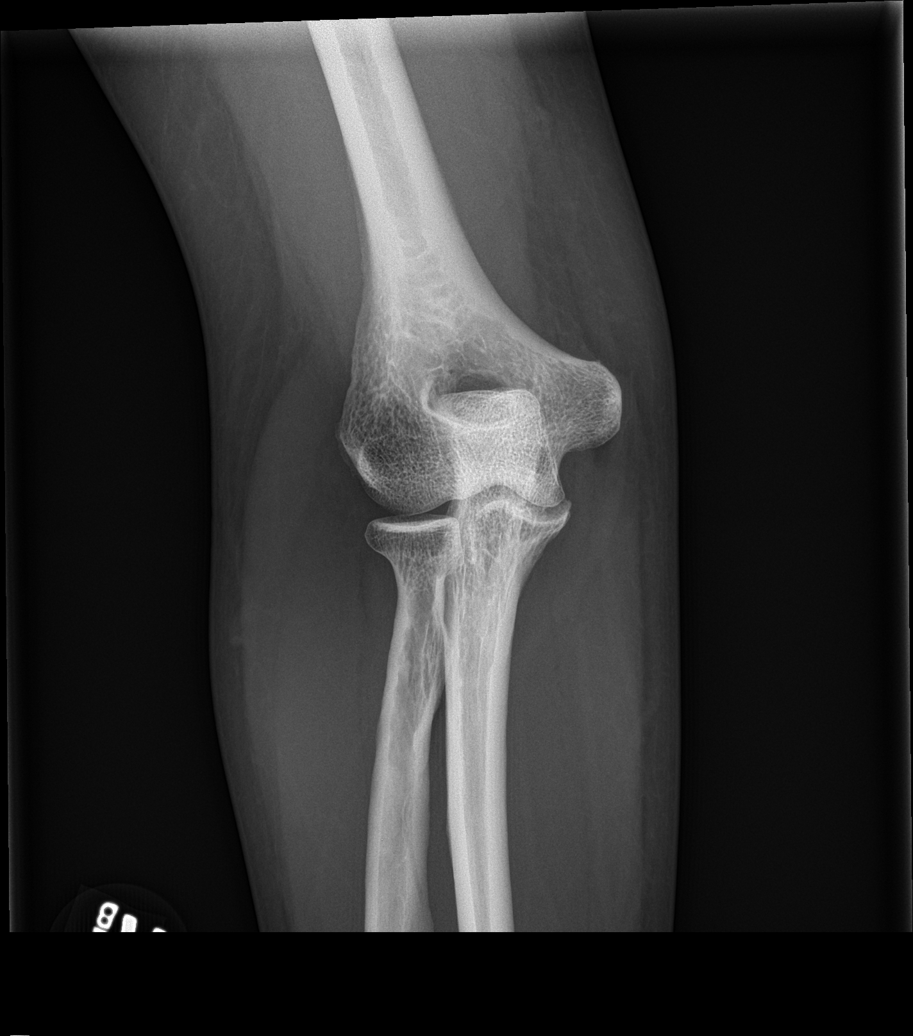

[elbow obl (1 of 2)]
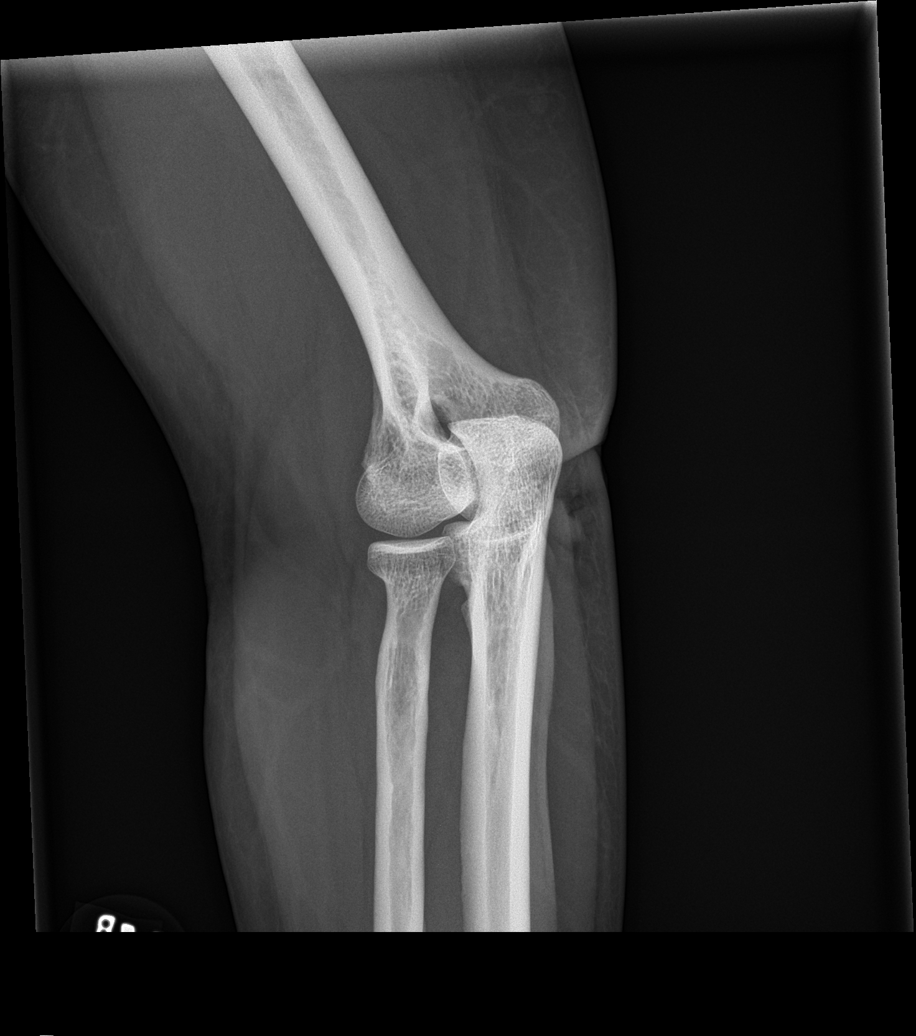

[elbow obl (2 of 2)]
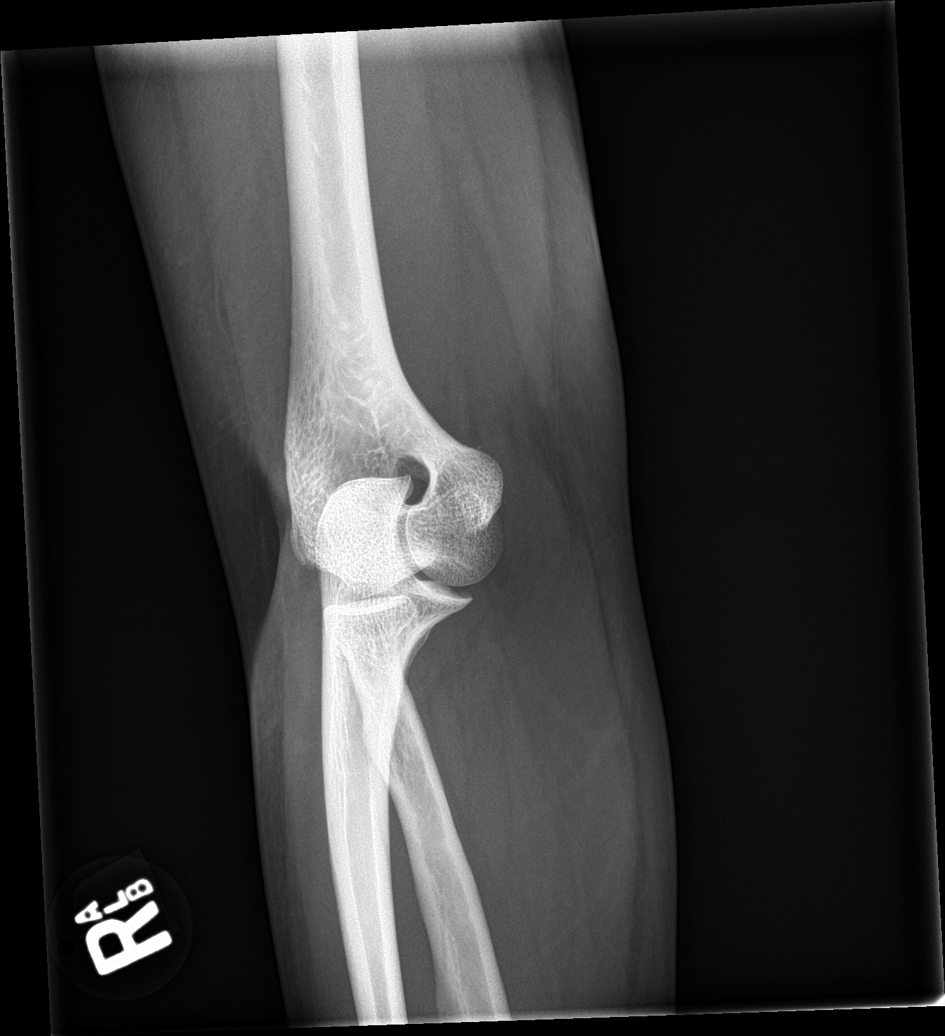

[elbow lat]
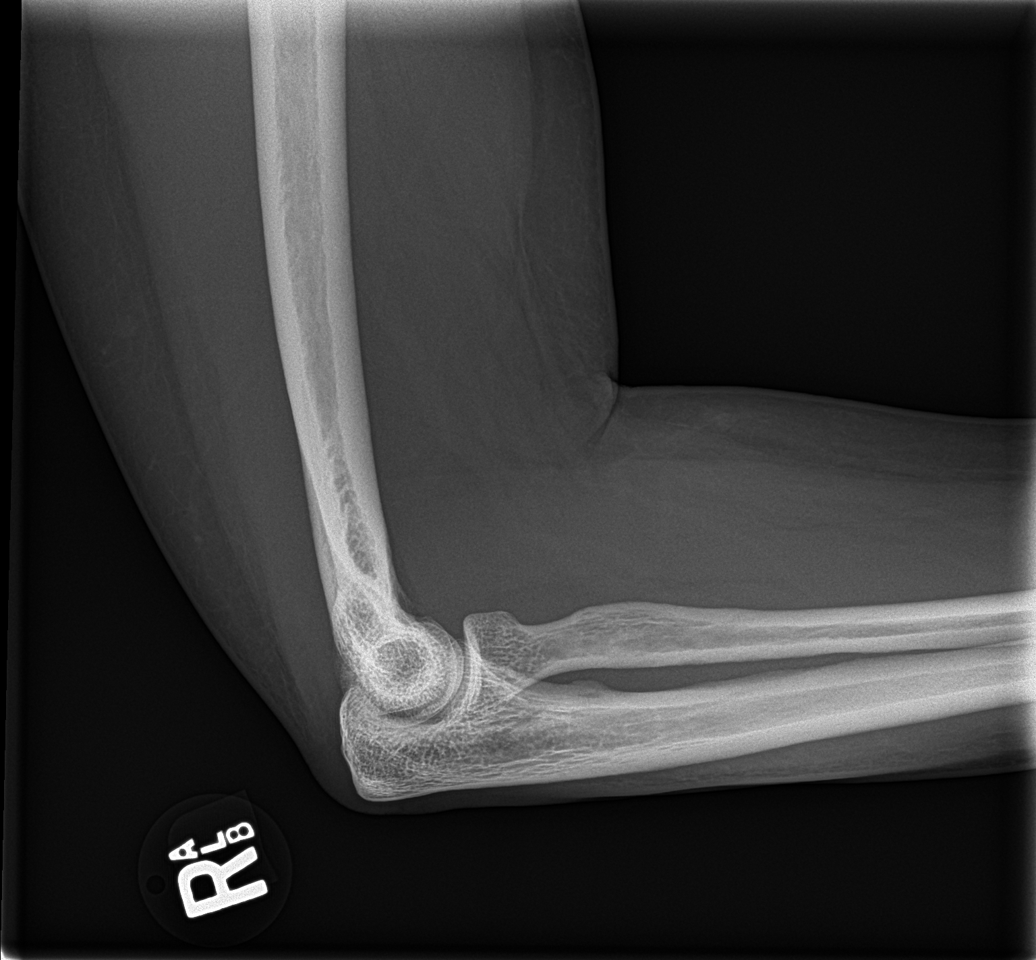

[4 of 4 positions shown; findings below may reference images not displayed]

FINDINGS: Mild peripheral medial compartment joint space narrowing and
degenerative osteophytosis. No joint effusion. Normal bone
mineralization. No acute fracture is seen. No dislocation. No
enthesopathic change is visualized with attention to the lateral
epicondyle.
IMPRESSION: :
IMPRESSION: 1. Very mild medial elbow osteoarthritis.
2. No enthesopathic changes/calcifications seen to indicate
radiographic evidence of the sequela of chronic tendinosis of the
common extensor tendon at the lateral epicondyle.

## 2023-06-14 ENCOUNTER — Encounter (INDEPENDENT_AMBULATORY_CARE_PROVIDER_SITE_OTHER): Payer: Self-pay
